# Patient Record
Sex: Female | Born: 2000 | Race: White | Hispanic: No | Marital: Single | Smoking: Never smoker
Health system: Southern US, Community
[De-identification: ages and names within clinical notes are randomized; demographics above are authoritative.]

## PROBLEM LIST (undated history)

## (undated) DIAGNOSIS — J45909 Unspecified asthma, uncomplicated: Secondary | ICD-10-CM

## (undated) DIAGNOSIS — F419 Anxiety disorder, unspecified: Secondary | ICD-10-CM

## (undated) DIAGNOSIS — N390 Urinary tract infection, site not specified: Secondary | ICD-10-CM

## (undated) DIAGNOSIS — N926 Irregular menstruation, unspecified: Secondary | ICD-10-CM

## (undated) DIAGNOSIS — N920 Excessive and frequent menstruation with regular cycle: Secondary | ICD-10-CM

## (undated) HISTORY — DX: Urinary tract infection, site not specified: N39.0

## (undated) HISTORY — DX: Irregular menstruation, unspecified: N92.6

## (undated) HISTORY — DX: Excessive and frequent menstruation with regular cycle: N92.0

## (undated) HISTORY — PX: NO PAST SURGERIES: SHX2092

---
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
## 2021-01-05 ENCOUNTER — Ambulatory Visit: Payer: Federal, State, Local not specified - PPO

## 2021-01-07 ENCOUNTER — Other Ambulatory Visit: Payer: Self-pay

## 2021-01-07 ENCOUNTER — Ambulatory Visit (INDEPENDENT_AMBULATORY_CARE_PROVIDER_SITE_OTHER): Payer: Federal, State, Local not specified - PPO | Admitting: Certified Nurse Midwife

## 2021-01-07 VITALS — BP 130/70 | HR 80 | Ht 62.0 in | Wt 128.6 lb

## 2021-01-07 DIAGNOSIS — Z3042 Encounter for surveillance of injectable contraceptive: Secondary | ICD-10-CM | POA: Diagnosis not present

## 2021-01-07 MED ORDER — MEDROXYPROGESTERONE ACETATE 150 MG/ML IM SUSP
150.0000 mg | Freq: Once | INTRAMUSCULAR | Status: AC
  Administered 2021-01-07: 150 mg via INTRAMUSCULAR

## 2021-01-07 NOTE — Progress Notes (Signed)
Pt presents for depo injection administered R glute with no adverse reactions. Patient verbalized no questions or concerns.

## 2021-02-07 ENCOUNTER — Other Ambulatory Visit: Payer: Self-pay

## 2021-02-07 ENCOUNTER — Emergency Department: Payer: Self-pay

## 2021-02-07 ENCOUNTER — Emergency Department
Admission: EM | Admit: 2021-02-07 | Discharge: 2021-02-07 | Disposition: A | Discharge status: Home / Self Care | Payer: Self-pay | Attending: Student in an Organized Health Care Education/Training Program | Admitting: Student in an Organized Health Care Education/Training Program

## 2021-02-07 DIAGNOSIS — R197 Diarrhea, unspecified: Secondary | ICD-10-CM | POA: Insufficient documentation

## 2021-02-07 DIAGNOSIS — R102 Pelvic and perineal pain: Secondary | ICD-10-CM | POA: Insufficient documentation

## 2021-02-07 DIAGNOSIS — R1030 Lower abdominal pain, unspecified: Secondary | ICD-10-CM

## 2021-02-07 LAB — COMPREHENSIVE METABOLIC PANEL
ALT: 15 U/L (ref 0–44)
AST: 16 U/L (ref 15–41)
Albumin: 5.1 g/dL — ABNORMAL HIGH (ref 3.5–5.0)
Alkaline Phosphatase: 75 U/L (ref 38–126)
Anion gap: 7 (ref 5–15)
BUN: 14 mg/dL (ref 6–20)
CO2: 24 mmol/L (ref 22–32)
Calcium: 9.7 mg/dL (ref 8.9–10.3)
Chloride: 107 mmol/L (ref 98–111)
Creatinine, Ser: 0.75 mg/dL (ref 0.44–1.00)
GFR, Estimated: >60 mL/min (ref >60)
Glucose, Bld: 124 mg/dL — ABNORMAL HIGH (ref 70–99)
Potassium: 3.7 mmol/L (ref 3.5–5.1)
Sodium: 138 mmol/L (ref 135–145)
Total Bilirubin: 0.7 mg/dL (ref 0.3–1.2)
Total Protein: 7.3 g/dL (ref 6.5–8.1)

## 2021-02-07 LAB — URINALYSIS, COMPLETE (UACMP) WITH MICROSCOPIC
Bilirubin Urine: NEGATIVE
Glucose, UA: NEGATIVE mg/dL
Hgb urine dipstick: NEGATIVE
Ketones, ur: NEGATIVE mg/dL
Leukocytes,Ua: NEGATIVE
Nitrite: NEGATIVE
Protein, ur: NEGATIVE mg/dL
Specific Gravity, Urine: 1.015 (ref 1.005–1.030)
pH: 6 (ref 5.0–8.0)

## 2021-02-07 LAB — CBC
HCT: 42.8 % (ref 36.0–46.0)
Hemoglobin: 15.6 g/dL — ABNORMAL HIGH (ref 12.0–15.0)
MCH: 30.9 pg (ref 26.0–34.0)
MCHC: 36.4 g/dL — ABNORMAL HIGH (ref 30.0–36.0)
MCV: 84.8 fL (ref 80.0–100.0)
Platelets: 219 10*3/uL (ref 150–400)
RBC: 5.05 MIL/uL (ref 3.87–5.11)
RDW: 11.8 % (ref 11.5–15.5)
WBC: 6.6 10*3/uL (ref 4.0–10.5)
nRBC: 0 % (ref 0.0–0.2)

## 2021-02-07 LAB — POC URINE PREG, ED: Preg Test, Ur: NEGATIVE

## 2021-02-07 LAB — LIPASE, BLOOD: Lipase: 38 U/L (ref 11–51)

## 2021-02-07 MED ORDER — ONDANSETRON 4 MG PO TBDP
4.0000 mg | ORAL_TABLET | Freq: Once | ORAL | Status: AC
  Administered 2021-02-07: 4 mg via ORAL
  Filled 2021-02-07: qty 1

## 2021-02-07 MED ORDER — KETOROLAC TROMETHAMINE 30 MG/ML IJ SOLN
30.0000 mg | Freq: Once | INTRAMUSCULAR | Status: AC
  Administered 2021-02-07: 30 mg via INTRAMUSCULAR
  Filled 2021-02-07: qty 1

## 2021-02-07 MED ORDER — ONDANSETRON 4 MG PO TBDP: 4.0000 mg | ORAL_TABLET | Freq: Three times a day (TID) | ORAL | 0 refills | Status: DC | PRN

## 2021-02-07 MED ORDER — DICYCLOMINE HCL 10 MG PO CAPS
10.0000 mg | ORAL_CAPSULE | Freq: Three times a day (TID) | ORAL | 0 refills | Status: DC | PRN
Start: 2021-02-07 — End: 2021-02-16

## 2021-02-07 NOTE — Discharge Instructions (Signed)

## 2021-02-07 NOTE — ED Triage Notes (Signed)
Pt here with abd pain that started 3 days ago. Pt states pain is lower across her abd and she also has N/V/D. Pt also has pain in her back.

## 2021-02-07 NOTE — ED Notes (Signed)
Patient transported to Ultrasound 

## 2021-02-07 NOTE — ED Provider Notes (Signed)
[facility] Emergency Department Provider Note    Event Date/Time   First MD Initiated Contact with Patient 02/07/21 1133     (approximate)  I have reviewed the triage vital signs and the nursing notes.   HISTORY  Chief Complaint Abdominal Pain    HPI Jennifer Francis is a 20 y.o. female below listed past medical history presents to the ER for evaluation of 3 days of pelvic pain associated with multiple episodes of watery nonbloody diarrhea.  Said that she has had roughly 20 episodes of diarrhea.  Denies any measured temperatures or chills.  Is never felt like this before.  No lateralizing pain.  Is uncomfortable on both sides.  She is on the Depo shot.  She denies any dysuria.  She denies any vaginal bleeding or discharge.  No history of IBD.  Past Medical History:  Diagnosis Date   Heavy periods    Irregular periods    UTI (urinary tract infection)    Family History  Problem Relation Age of Onset   Breast cancer Maternal Grandmother        not sure of age   Diabetes Maternal Grandmother    Hypertension Maternal Grandfather    Ovarian cancer Neg Hx    Colon cancer Neg Hx    No past surgical history on file. Patient Active Problem List   Diagnosis Date Noted   Irregular menses 03/29/2018      Prior to Admission medications   Medication Sig Start Date End Date Taking? Authorizing Provider  dicyclomine (BENTYL) 10 MG capsule Take 1 capsule (10 mg total) by mouth 3 (three) times daily as needed for up to 14 days for spasms. 02/07/21 02/21/21 Yes Willy Eddy, MD  ondansetron (ZOFRAN ODT) 4 MG disintegrating tablet Take 1 tablet (4 mg total) by mouth every 8 (eight) hours as needed for nausea or vomiting. 02/07/21  Yes Willy Eddy, MD  medroxyPROGESTERone (DEPO-PROVERA) 150 MG/ML injection Inject 1 mL (150 mg total) into the muscle every 3 (three) months. 10/27/20   Doreene Burke, CNM    Allergies Sulfa antibiotics    Social  History Social History   Tobacco Use   Smoking status: Never   Smokeless tobacco: Never  Vaping Use   Vaping Use: Never used  Substance Use Topics   Alcohol use: Never   Drug use: Never    Review of Systems Patient denies headaches, rhinorrhea, blurry vision, numbness, shortness of breath, chest pain, edema, cough, abdominal pain, nausea, vomiting, diarrhea, dysuria, fevers, rashes or hallucinations unless otherwise stated above in HPI. ____________________________________________   PHYSICAL EXAM:  VITAL SIGNS: Vitals:   02/07/21 1010  BP: 108/78  Pulse: 74  Resp: 18  Temp: 99.2 F (37.3 C)  SpO2: 98%    Constitutional: Alert and oriented.  Eyes: Conjunctivae are normal.  Head: Atraumatic. Nose: No congestion/rhinnorhea. Mouth/Throat: Mucous membranes are moist.   Neck: No stridor. Painless ROM.  Cardiovascular: Normal rate, regular rhythm. Grossly normal heart sounds.  Good peripheral circulation. Respiratory: Normal respiratory effort.  No retractions. Lungs CTAB. Gastrointestinal: Soft and nontender. No distention. No abdominal bruits. No CVA tenderness. Genitourinary:  Musculoskeletal: No lower extremity tenderness nor edema.  No joint effusions. Neurologic:  Normal speech and language. No gross focal neurologic deficits are appreciated. No facial droop Skin:  Skin is warm, dry and intact. No rash noted. Psychiatric: Mood and affect are normal. Speech and behavior are normal.  ____________________________________________   LABS (all labs ordered are listed, but  only abnormal results are displayed)  Results for orders placed or performed during the hospital encounter of 02/07/21 (from the past 24 hour(s))  Lipase, blood     Status: None   Collection Time: 02/07/21 10:14 AM  Result Value Ref Range   Lipase 38 11 - 51 U/L  Comprehensive metabolic panel     Status: Abnormal   Collection Time: 02/07/21 10:14 AM  Result Value Ref Range   Sodium 138 135 - 145  mmol/L   Potassium 3.7 3.5 - 5.1 mmol/L   Chloride 107 98 - 111 mmol/L   CO2 24 22 - 32 mmol/L   Glucose, Bld 124 (H) 70 - 99 mg/dL   BUN 14 6 - 20 mg/dL   Creatinine, Ser 9.48 0.44 - 1.00 mg/dL   Calcium 9.7 8.9 - 54.6 mg/dL   Total Protein 7.3 6.5 - 8.1 g/dL   Albumin 5.1 (H) 3.5 - 5.0 g/dL   AST 16 15 - 41 U/L   ALT 15 0 - 44 U/L   Alkaline Phosphatase 75 38 - 126 U/L   Total Bilirubin 0.7 0.3 - 1.2 mg/dL   GFR, Estimated >27 >03 mL/min   Anion gap 7 5 - 15  CBC     Status: Abnormal   Collection Time: 02/07/21 10:14 AM  Result Value Ref Range   WBC 6.6 4.0 - 10.5 K/uL   RBC 5.05 3.87 - 5.11 MIL/uL   Hemoglobin 15.6 (H) 12.0 - 15.0 g/dL   HCT 50.0 93.8 - 18.2 %   MCV 84.8 80.0 - 100.0 fL   MCH 30.9 26.0 - 34.0 pg   MCHC 36.4 (H) 30.0 - 36.0 g/dL   RDW 99.3 71.6 - 96.7 %   Platelets 219 150 - 400 K/uL   nRBC 0.0 0.0 - 0.2 %  Urinalysis, Complete w Microscopic Urine, Random     Status: Abnormal   Collection Time: 02/07/21 10:14 AM  Result Value Ref Range   Color, Urine YELLOW YELLOW   APPearance CLEAR CLEAR   Specific Gravity, Urine 1.015 1.005 - 1.030   pH 6.0 5.0 - 8.0   Glucose, UA NEGATIVE NEGATIVE mg/dL   Hgb urine dipstick NEGATIVE NEGATIVE   Bilirubin Urine NEGATIVE NEGATIVE   Ketones, ur NEGATIVE NEGATIVE mg/dL   Protein, ur NEGATIVE NEGATIVE mg/dL   Nitrite NEGATIVE NEGATIVE   Leukocytes,Ua NEGATIVE NEGATIVE   RBC / HPF 0-5 0 - 5 RBC/hpf   WBC, UA 0-5 0 - 5 WBC/hpf   Bacteria, UA RARE (A) NONE SEEN   Squamous Epithelial / LPF 0-5 0 - 5   Mucus PRESENT   POC urine preg, ED     Status: None   Collection Time: 02/07/21 10:59 AM  Result Value Ref Range   Preg Test, Ur NEGATIVE NEGATIVE   ____________________________________________ ____________________________________________  RADIOLOGY  I personally reviewed all radiographic images ordered to evaluate for the above acute complaints and reviewed radiology reports and findings.  These findings were  personally discussed with the patient.  Please see medical record for radiology report.  ____________________________________________   PROCEDURES  Procedure(s) performed:  Procedures    Critical Care performed: no ____________________________________________   INITIAL IMPRESSION / ASSESSMENT AND PLAN / ED COURSE  Pertinent labs & imaging results that were available during my care of the patient were reviewed by me and considered in my medical decision making (see chart for details).   DDX: Colitis, enteritis, torsion, cyst, pregnancy, UTI, ascites, diverticulitis, IBD, IBS  Jennifer Francis  is a 20 y.o. who presents to the ED with presentation as described above.  She is clinically very well-appearing no acute distress.  No fever no tachycardia.  No white count.  Blood work otherwise reassuring.  Denies any dysuria or urinary tract symptoms.  Denies any symptoms of PID.  Ultrasound will be ordered to evaluate for lower abdominal pain.  I have a lower suspicion for appendicitis based on reassuring exam.  And diarrheal illness higher suspicion that she may have some enteritis is worsening quite a few patients over the past few days with similar symptoms.  Clinical Course as of 02/07/21 1324  Sun Feb 07, 2021  1316 Patient reassessed.  Feels well.  No additional diarrhea here in the ER.  Given reassuring work-up do not feel that CT imaging of the abdomen is clinically indicated given lack of pain white count or fever.  Have a low suspicion for appendicitis.  Most likely viral illness but at this point he wishes stable and appropriate for observation as an outpatient with strict return precautions and patient is agreeable to plan. [PR]    Clinical Course User Index [PR] Willy Eddy, MD    The patient was evaluated in Emergency Department today for the symptoms described in the history of present illness. He/she was evaluated in the context of the global COVID-19 pandemic, which  necessitated consideration that the patient might be at risk for infection with the SARS-CoV-2 virus that causes COVID-19. Institutional protocols and algorithms that pertain to the evaluation of patients at risk for COVID-19 are in a state of rapid change based on information released by regulatory bodies including the CDC and federal and state organizations. These policies and algorithms were followed during the patient's care in the ED.  As part of my medical decision making, I reviewed the following data within the electronic MEDICAL RECORD NUMBER Nursing notes reviewed and incorporated, Labs reviewed, notes from prior ED visits and [place] Controlled Substance Database   ____________________________________________   FINAL CLINICAL IMPRESSION(S) / ED DIAGNOSES  Final diagnoses:  Pelvic pain  Lower abdominal pain  Diarrhea, unspecified type      NEW MEDICATIONS STARTED DURING THIS VISIT:  New Prescriptions   DICYCLOMINE (BENTYL) 10 MG CAPSULE    Take 1 capsule (10 mg total) by mouth 3 (three) times daily as needed for up to 14 days for spasms.   ONDANSETRON (ZOFRAN ODT) 4 MG DISINTEGRATING TABLET    Take 1 tablet (4 mg total) by mouth every 8 (eight) hours as needed for nausea or vomiting.     Note:  This document was prepared using Dragon voice recognition software and may include unintentional dictation errors.    Willy Eddy, MD 02/07/21 1324

## 2021-02-09 ENCOUNTER — Other Ambulatory Visit: Payer: Self-pay

## 2021-02-09 ENCOUNTER — Emergency Department
Admission: EM | Admit: 2021-02-09 | Discharge: 2021-02-09 | Disposition: A | Discharge status: Home / Self Care | Payer: Self-pay | Attending: Emergency Medicine | Admitting: Emergency Medicine

## 2021-02-09 ENCOUNTER — Emergency Department: Payer: Self-pay

## 2021-02-09 ENCOUNTER — Encounter: Payer: Self-pay | Admitting: Radiology

## 2021-02-09 DIAGNOSIS — R1031 Right lower quadrant pain: Secondary | ICD-10-CM | POA: Insufficient documentation

## 2021-02-09 DIAGNOSIS — R1032 Left lower quadrant pain: Secondary | ICD-10-CM | POA: Insufficient documentation

## 2021-02-09 DIAGNOSIS — R197 Diarrhea, unspecified: Secondary | ICD-10-CM | POA: Insufficient documentation

## 2021-02-09 LAB — URINALYSIS, COMPLETE (UACMP) WITH MICROSCOPIC
Bilirubin Urine: NEGATIVE
Glucose, UA: NEGATIVE mg/dL
Hgb urine dipstick: NEGATIVE
Ketones, ur: NEGATIVE mg/dL
Nitrite: NEGATIVE
Protein, ur: NEGATIVE mg/dL
Specific Gravity, Urine: 1.015 (ref 1.005–1.030)
pH: 7.5 (ref 5.0–8.0)

## 2021-02-09 LAB — COMPREHENSIVE METABOLIC PANEL
ALT: 13 U/L (ref 0–44)
AST: 14 U/L — ABNORMAL LOW (ref 15–41)
Albumin: 4.8 g/dL (ref 3.5–5.0)
Alkaline Phosphatase: 74 U/L (ref 38–126)
Anion gap: 6 (ref 5–15)
BUN: 10 mg/dL (ref 6–20)
CO2: 24 mmol/L (ref 22–32)
Calcium: 9.3 mg/dL (ref 8.9–10.3)
Chloride: 108 mmol/L (ref 98–111)
Creatinine, Ser: 0.79 mg/dL (ref 0.44–1.00)
GFR, Estimated: >60 mL/min (ref >60)
Glucose, Bld: 101 mg/dL — ABNORMAL HIGH (ref 70–99)
Potassium: 4.3 mmol/L (ref 3.5–5.1)
Sodium: 138 mmol/L (ref 135–145)
Total Bilirubin: 0.6 mg/dL (ref 0.3–1.2)
Total Protein: 7 g/dL (ref 6.5–8.1)

## 2021-02-09 LAB — CBC
HCT: 39.7 % (ref 36.0–46.0)
Hemoglobin: 14.5 g/dL (ref 12.0–15.0)
MCH: 30.6 pg (ref 26.0–34.0)
MCHC: 36.5 g/dL — ABNORMAL HIGH (ref 30.0–36.0)
MCV: 83.8 fL (ref 80.0–100.0)
Platelets: 201 10*3/uL (ref 150–400)
RBC: 4.74 MIL/uL (ref 3.87–5.11)
RDW: 11.6 % (ref 11.5–15.5)
WBC: 5.7 10*3/uL (ref 4.0–10.5)
nRBC: 0 % (ref 0.0–0.2)

## 2021-02-09 LAB — POC URINE PREG, ED: Preg Test, Ur: NEGATIVE

## 2021-02-09 MED ORDER — IOHEXOL 350 MG/ML SOLN
75.0000 mL | Freq: Once | INTRAVENOUS | Status: AC | PRN
  Administered 2021-02-09: 75 mL via INTRAVENOUS
  Filled 2021-02-09: qty 75

## 2021-02-09 MED ORDER — KETOROLAC TROMETHAMINE 30 MG/ML IJ SOLN
15.0000 mg | Freq: Once | INTRAMUSCULAR | Status: AC
  Administered 2021-02-09: 15 mg via INTRAVENOUS
  Filled 2021-02-09: qty 1

## 2021-02-09 NOTE — ED Provider Notes (Signed)
[facility]  ____________________________________________   Event Date/Time   First MD Initiated Contact with Patient 02/09/21 1259     (approximate)  I have reviewed the triage vital signs and the nursing notes.   HISTORY  Chief Complaint Abdominal Pain    HPI Jennifer Francis is a 20 y.o. female with past medical history that is noncontributory who presents with abdominal pain.  Symptoms have been going on for the past 4 days.  Pain was initially located in the bilateral lower quadrants and lower abdomen.  She has had multiple days of diarrhea, denies blood in her stool.  She denies urinary symptoms vaginal discharge or vaginal bleeding.  She is on Depo does not get a regular..  She was seen yesterday in the ED and had an ultrasound of the pelvis that was normal.  She was told to return to the emergency department if pain localized to the right lower quadrant.  Today the pain is primarily in the right lower quadrant.  Stabbing in nature.  She is tolerating p.o. no nausea or vomiting.         Past Medical History:  Diagnosis Date   Heavy periods    Irregular periods    UTI (urinary tract infection)     Patient Active Problem List   Diagnosis Date Noted   Irregular menses 03/29/2018    No past surgical history on file.  Prior to Admission medications   Medication Sig Start Date End Date Taking? Authorizing Provider  dicyclomine (BENTYL) 10 MG capsule Take 1 capsule (10 mg total) by mouth 3 (three) times daily as needed for up to 14 days for spasms. 02/07/21 02/21/21  Willy Eddy, MD  medroxyPROGESTERone (DEPO-PROVERA) 150 MG/ML injection Inject 1 mL (150 mg total) into the muscle every 3 (three) months. 10/27/20   Doreene Burke, CNM  ondansetron (ZOFRAN ODT) 4 MG disintegrating tablet Take 1 tablet (4 mg total) by mouth every 8 (eight) hours as needed for nausea or vomiting. 02/07/21   Willy Eddy, MD    Allergies Sulfa  antibiotics  Family History  Problem Relation Age of Onset   Breast cancer Maternal Grandmother        not sure of age   Diabetes Maternal Grandmother    Hypertension Maternal Grandfather    Ovarian cancer Neg Hx    Colon cancer Neg Hx     Social History Social History   Tobacco Use   Smoking status: Never   Smokeless tobacco: Never  Vaping Use   Vaping Use: Never used  Substance Use Topics   Alcohol use: Never   Drug use: Never    Review of Systems   Review of Systems  Constitutional:  Negative for chills and fever.  Respiratory:  Negative for shortness of breath.   Cardiovascular:  Negative for chest pain.  Gastrointestinal:  Positive for abdominal pain and diarrhea. Negative for nausea and vomiting.  Genitourinary:  Negative for dysuria, vaginal bleeding, vaginal discharge and vaginal pain.  All other systems reviewed and are negative.  Physical Exam Updated Vital Signs BP 115/66 (BP Location: Right Arm)   Pulse 78   Temp 98.7 F (37.1 C) (Oral)   Resp 16   Ht 5\' 2"  (1.575 m)   Wt 59.4 kg   SpO2 98%   BMI 23.96 kg/m   Physical Exam Vitals and nursing note reviewed.  Constitutional:      General: She is not in acute distress.    Appearance: Normal  appearance.  HENT:     Head: Normocephalic and atraumatic.  Eyes:     General: No scleral icterus.    Conjunctiva/sclera: Conjunctivae normal.  Pulmonary:     Effort: Pulmonary effort is normal. No respiratory distress.     Breath sounds: No stridor.  Abdominal:     Tenderness: There is abdominal tenderness.     Comments: Min is soft throughout, mild tenderness to deep palpation in the right lower quadrant, no masses no guarding  Musculoskeletal:        General: No deformity or signs of injury.     Cervical back: Normal range of motion.  Skin:    General: Skin is dry.     Coloration: Skin is not jaundiced or pale.  Neurological:     General: No focal deficit present.     Mental Status: She is alert  and oriented to person, place, and time. Mental status is at baseline.  Psychiatric:        Mood and Affect: Mood normal.        Behavior: Behavior normal.     LABS (all labs ordered are listed, but only abnormal results are displayed)  Labs Reviewed  COMPREHENSIVE METABOLIC PANEL - Abnormal; Notable for the following components:      Result Value   Glucose, Bld 101 (*)    AST 14 (*)    All other components within normal limits  CBC - Abnormal; Notable for the following components:   MCHC 36.5 (*)    All other components within normal limits  URINALYSIS, COMPLETE (UACMP) WITH MICROSCOPIC - Abnormal; Notable for the following components:   Leukocytes,Ua TRACE (*)    Bacteria, UA RARE (*)    All other components within normal limits  POC URINE PREG, ED   ____________________________________________  EKG  N/a ____________________________________________  RADIOLOGY Ky Barban, personally viewed and evaluated these images (plain radiographs) as part of my medical decision making, as well as reviewing the written report by the radiologist.  ED MD interpretation: CT abdomen pelvis with contrast obtained and reviewed by radiology myself as negative for acute pathology    ____________________________________________   PROCEDURES  Procedure(s) performed (including Critical Care):  Procedures   ____________________________________________   INITIAL IMPRESSION / ASSESSMENT AND PLAN / ED COURSE     20 year old female presents for second visit for abdominal pain.  Was generalized now localizing to the right lower quadrant associate with diarrhea.  She is very well-appearing on exam and her abdomen is largely benign but her tenderness does localize to the right lower quadrant.  Ultrasound of the pelvis obtained yesterday which was normal.  Her labs today are normal no leukocytosis.  Given this is her second visit and pain has changed we will obtain a CT abdomen  pelvis to rule out appendicitis.  CT abdomen pelvis is normal.  Patient is tolerating p.o.  She is stable for discharge.  Advised that she follow-up with her primary care provider if her symptoms are ongoing.  Clinical Course as of 02/09/21 1513  Tue Feb 09, 2021  1510 IMPRESSION: No acute abdominopelvic findings. Normal appendix.   [KM]    Clinical Course User Index [KM] Georga Hacking, MD     ____________________________________________   FINAL CLINICAL IMPRESSION(S) / ED DIAGNOSES  Final diagnoses:  Right lower quadrant abdominal pain     ED Discharge Orders     None        Note:  This document was prepared using  [employer]agon [employer] and may include unintentional dictation errors.    Georga Hacking, MD 02/09/21 1515

## 2021-02-09 NOTE — Discharge Instructions (Addendum)
Your CAT scan was negative for appendicitis or other causes of abdominal pain.  I expect that your pain should improve over the next several days.  Please follow-up with your primary care provider for evaluation if you continue to have symptoms.

## 2021-02-09 NOTE — ED Triage Notes (Signed)
Pt here by POV with c/o of ABD pain and was seen yesterday for the same and wad told "if the pain moved to the other side to come back immediately".   Pt denies fevers or chills. NAD noted.

## 2021-02-16 ENCOUNTER — Other Ambulatory Visit: Payer: Self-pay

## 2021-02-16 ENCOUNTER — Other Ambulatory Visit: Payer: Self-pay | Admitting: Obstetrics & Gynecology

## 2021-02-16 ENCOUNTER — Ambulatory Visit (INDEPENDENT_AMBULATORY_CARE_PROVIDER_SITE_OTHER): Payer: Self-pay | Admitting: Obstetrics & Gynecology

## 2021-02-16 ENCOUNTER — Encounter: Payer: Self-pay | Admitting: Obstetrics & Gynecology

## 2021-02-16 ENCOUNTER — Telehealth: Payer: Self-pay

## 2021-02-16 VITALS — BP 120/80 | Ht 62.0 in | Wt 129.0 lb

## 2021-02-16 DIAGNOSIS — R102 Pelvic and perineal pain: Secondary | ICD-10-CM

## 2021-02-16 MED ORDER — DOXYCYCLINE HYCLATE 100 MG PO CAPS: 100.0000 mg | ORAL_CAPSULE | Freq: Two times a day (BID) | ORAL | 0 refills | Status: DC

## 2021-02-16 NOTE — Telephone Encounter (Signed)
Called patient to schedule Diagnostic Laparoscopy w Phylliss Blakes 9/22  H&P today in clinic  Pre-admit phone call appointment to be requested - date and time will be included on H&P paper work. Also all appointments will be updated on pt MyChart. Explained that this appointment has a call window. Based on the time scheduled will indicate if the call will be received within a 4 hour window before 1:00 or after.  Advised that pt may also receive calls from the hospital pharmacy and pre-service center.  Patient is having mother check on their insurance. She is on her mother's [employer] plan but it is showing as inactive as of 02/03/21. She will let me know what is figured out.

## 2021-02-16 NOTE — Progress Notes (Signed)
PRE-OPERATIVE HISTORY AND PHYSICAL EXAM  HPI:  Jennifer Francis is a 20 y.o. G0P0000 No LMP recorded. Patient has had an injection.; she is being admitted for surgery related to pelvic pain.  PMHx: Past Medical History:  Diagnosis Date   Heavy periods    Irregular periods    UTI (urinary tract infection)    History reviewed. No pertinent surgical history. Family History  Problem Relation Age of Onset   Breast cancer Maternal Grandmother        not sure of age   Diabetes Maternal Grandmother    Hypertension Maternal Grandfather    Ovarian cancer Neg Hx    Colon cancer Neg Hx    Social History   Tobacco Use   Smoking status: Never   Smokeless tobacco: Never  Vaping Use   Vaping Use: Never used  Substance Use Topics   Alcohol use: Never   Drug use: Never    Current Outpatient Medications:    doxycycline (VIBRAMYCIN) 100 MG capsule, Take 1 capsule (100 mg total) by mouth 2 (two) times daily., Disp: 14 capsule, Rfl: 0   medroxyPROGESTERone (DEPO-PROVERA) 150 MG/ML injection, Inject 1 mL (150 mg total) into the muscle every 3 (three) months., Disp: 1 mL, Rfl: 4  Current Facility-Administered Medications:    medroxyPROGESTERone (DEPO-PROVERA) injection 150 mg, 150 mg, Intramuscular, Q90 days, Doreene Burke, CNM, 150 mg at 10/27/20 1349 Allergies: Sulfa antibiotics  Review of Systems  Constitutional:  Positive for malaise/fatigue and weight loss. Negative for chills and fever.  HENT:  Negative for congestion, sinus pain and sore throat.   Eyes:  Negative for blurred vision and pain.  Respiratory:  Negative for cough and wheezing.   Cardiovascular:  Negative for chest pain and leg swelling.  Gastrointestinal:  Positive for abdominal pain and diarrhea. Negative for constipation, heartburn, nausea and vomiting.  Genitourinary:  Positive for frequency. Negative for dysuria, hematuria and urgency.  Musculoskeletal:  Positive for joint pain. Negative for back pain, myalgias  and neck pain.  Skin:  Negative for itching and rash.  Neurological:  Positive for weakness. Negative for dizziness and tremors.  Endo/Heme/Allergies:  Does not bruise/bleed easily.  Psychiatric/Behavioral:  Positive for depression. The patient is nervous/anxious. The patient does not have insomnia.    Objective: BP 120/80   Ht 5\' 2"  (1.575 m)   Wt 129 lb (58.5 kg)   BMI 23.59 kg/m   Filed Weights   02/16/21 0948  Weight: 129 lb (58.5 kg)   Physical Exam Constitutional:      General: She is not in acute distress.    Appearance: She is well-developed.  Genitourinary:     Right Labia: No rash or tenderness.    Left Labia: No tenderness or rash.    Vaginal tenderness present.     No vaginal erythema or bleeding.      Right Adnexa: tender.    Right Adnexa: no mass present.    Left Adnexa: not tender and no mass present.    Cervical motion tenderness present.     No cervical discharge, polyp or nabothian cyst.     Uterus is tender.     Uterus is not enlarged.     No uterine mass detected.    Uterus is midaxial.     Pelvic exam was performed with patient in the lithotomy position.  HENT:     Head: Normocephalic and atraumatic.     Nose: Nose normal.  Abdominal:  General: There is no distension.     Palpations: Abdomen is soft.     Tenderness: There is generalized abdominal tenderness.  Musculoskeletal:        General: Normal range of motion.  Neurological:     Mental Status: She is alert and oriented to person, place, and time.     Cranial Nerves: No cranial nerve deficit.  Skin:    General: Skin is warm and dry.  Psychiatric:        Attention and Perception: Attention normal.        Mood and Affect: Mood and affect normal.        Speech: Speech normal.        Behavior: Behavior normal.        Thought Content: Thought content normal.        Judgment: Judgment normal.    Assessment: 1. Pelvic pain   Plan Dx Lap to assess for endometriosis or alternative  etiology Also pt is being treated for possible PID  I have had a careful discussion with this patient about all the options available and the risk/benefits of each. I have fully informed this patient that surgery may subject her to a variety of discomforts and risks: She understands that most patients have surgery with little difficulty, but problems can happen ranging from minor to fatal. These include nausea, vomiting, pain, bleeding, infection, poor healing, hernia, or formation of adhesions. Unexpected reactions may occur from any drug or anesthetic given. Unintended injury may occur to other pelvic or abdominal structures such as Fallopian tubes, ovaries, bladder, ureter (tube from kidney to bladder), or bowel. Nerves going from the pelvis to the legs may be injured. Any such injury may require immediate or later additional surgery to correct the problem. Excessive blood loss requiring transfusion is very unlikely but possible. Dangerous blood clots may form in the legs or lungs. Physical and sexual activity will be restricted in varying degrees for an indeterminate period of time but most often 2-6 weeks.  Finally, she understands that it is impossible to list every possible undesirable effect and that the condition for which surgery is done is not always cured or significantly improved, and in rare cases may be even worse.Ample time was given to answer all questions.  Annamarie Major, MD, [facility], [facility] 02/16/2021  10:50 AM

## 2021-02-16 NOTE — Progress Notes (Signed)
Gynecology Pelvic Pain Evaluation   Chief Complaint:  Chief Complaint  Patient presents with   Follow-up    History of Present Illness:   Patient is a 20 y.o. G0P0000 who LMP was No LMP recorded. Patient has had an injection., presents today for a problem visit.  She complains of pain.   Her pain is localized to the deep pelvis area, described as constant, stabbing, and aching, began several months ago and its severity is described as severe. The pain radiates to the  Non-radiating. She has these associated symptoms which include diarrhea and nausea. Patient has these modifiers which include pain medication that make it better and unable to associate with any factor that make it worse.  Context includes: spontaneous.  Their symptoms are aggravated by activity and sex.  Previous evaluation: multiple ER visits over the last week.  She has seen gyn in past, on Depo for periods and birth control (has nop periods).  ER- [place], CT, cultures all neg.  Doxy trial, no relief but only took 2 days. Prior Diagnosis: none. Previous Treatment: NSAID Naprosyn.  PMHx: She  has a past medical history of Heavy periods, Irregular periods, and UTI (urinary tract infection). Also,  has no past surgical history on file., family history includes Breast cancer in her maternal grandmother; Diabetes in her maternal grandmother; Hypertension in her maternal grandfather.,  reports that she has never smoked. She has never used smokeless tobacco. She reports that she does not drink alcohol and does not use drugs.  She has a current medication list which includes the following prescription(s): doxycycline and medroxyprogesterone, and the following Facility-Administered Medications: medroxyprogesterone. Also, is allergic to sulfa antibiotics.  Review of Systems  Constitutional:  Positive for malaise/fatigue and weight loss. Negative for chills and fever.  HENT:  Negative for congestion, sinus pain and sore throat.   Eyes:   Negative for blurred vision and pain.  Respiratory:  Negative for cough and wheezing.   Cardiovascular:  Negative for chest pain and leg swelling.  Gastrointestinal:  Positive for abdominal pain, diarrhea and nausea. Negative for constipation, heartburn and vomiting.  Genitourinary:  Positive for frequency. Negative for dysuria, hematuria and urgency.  Musculoskeletal:  Positive for joint pain. Negative for back pain, myalgias and neck pain.  Skin:  Negative for itching and rash.  Neurological:  Positive for dizziness and weakness. Negative for tremors.  Endo/Heme/Allergies:  Does not bruise/bleed easily.  Psychiatric/Behavioral:  Positive for depression. The patient is nervous/anxious. The patient does not have insomnia.    Objective: BP 120/80   Ht 5\' 2"  (1.575 m)   Wt 129 lb (58.5 kg)   BMI 23.59 kg/m  Physical Exam Constitutional:      General: She is not in acute distress.    Appearance: She is well-developed.  Genitourinary:     Right Labia: No rash or tenderness.    Left Labia: No tenderness or rash.    Vaginal tenderness present.     No vaginal erythema or bleeding.      Right Adnexa: tender.    Right Adnexa: no mass present.    Left Adnexa: tender.    Left Adnexa: no mass present.    Cervical motion tenderness present.     No cervical discharge, polyp or nabothian cyst.     Uterus is tender.     Uterus is not enlarged.     No uterine mass detected.    Uterus is midaxial.     Pelvic exam was  performed with patient in the lithotomy position.  HENT:     Head: Normocephalic and atraumatic.     Nose: Nose normal.  Abdominal:     General: There is no distension.     Palpations: Abdomen is soft.     Tenderness: There is generalized abdominal tenderness.     Comments: Worse on RLQ  Musculoskeletal:        General: Normal range of motion.  Neurological:     Mental Status: She is alert and oriented to person, place, and time.     Cranial Nerves: No cranial nerve  deficit.  Skin:    General: Skin is warm and dry.  Psychiatric:        Attention and Perception: Attention normal.        Mood and Affect: Mood and affect normal.        Speech: Speech normal.        Behavior: Behavior normal.        Thought Content: Thought content normal.        Judgment: Judgment normal.    Female chaperone present for pelvic portion of the physical exam  Assessment: 20 y.o. G0P0000 with  1. Pelvic pain Restart and finish course of Doxycycline as there is inflammatory nature to her pain; even w neg cervicovag cultures for chlamydia it could still be PID  Plan Dx Lap as this has chronic nature to it as well, for endometriosis Scans all normal, low suspicion for mass or cyst  Depo helped w periods, not pain Consider alternative options, may be based on surgery results  Annamarie Major, MD, [facility], [facility] 02/16/2021  10:43 AM

## 2021-02-16 NOTE — Telephone Encounter (Signed)
-----   Message from Nadara Mustard, MD sent at 02/16/2021 10:40 AM EDT ----- Regarding: Surgery Surgery Booking Request Patient Full Name:  Jennifer Francis  MRN: [id number]  DOB: 2001/01/25  Surgeon: Letitia Libra, MD  Requested Surgery Date and Time: 9/22 or 9/27 Primary Diagnosis AND Code: Pelvic pain Secondary Diagnosis and Code:  Surgical Procedure: Diagnostic Laparoscopy RNFA Requested?: No L&D Notification: No Admission Status: same day surgery Length of Surgery: 50 min Special Case Needs: No H&P: No   -   CONSENTS DONE TODAY Phone Interview???:  Yes Interpreter: No Medical Clearance:  No Special Scheduling Instructions: No Any known health/anesthesia issues, diabetes, sleep apnea, latex allergy, defibrillator/pacemaker?: No Acuity: P3   (P1 highest, P2 delay may cause harm, P3 low, elective gyn, P4 lowest) Post op follow up visits: yes

## 2021-02-16 NOTE — H&P (View-Only) (Signed)
PRE-OPERATIVE HISTORY AND PHYSICAL EXAM  HPI:  Jennifer Francis is a 20 y.o. G0P0000 No LMP recorded. Patient has had an injection.; she is being admitted for surgery related to pelvic pain.  PMHx: Past Medical History:  Diagnosis Date   Heavy periods    Irregular periods    UTI (urinary tract infection)    History reviewed. No pertinent surgical history. Family History  Problem Relation Age of Onset   Breast cancer Maternal Grandmother        not sure of age   Diabetes Maternal Grandmother    Hypertension Maternal Grandfather    Ovarian cancer Neg Hx    Colon cancer Neg Hx    Social History   Tobacco Use   Smoking status: Never   Smokeless tobacco: Never  Vaping Use   Vaping Use: Never used  Substance Use Topics   Alcohol use: Never   Drug use: Never    Current Outpatient Medications:    doxycycline (VIBRAMYCIN) 100 MG capsule, Take 1 capsule (100 mg total) by mouth 2 (two) times daily., Disp: 14 capsule, Rfl: 0   medroxyPROGESTERone (DEPO-PROVERA) 150 MG/ML injection, Inject 1 mL (150 mg total) into the muscle every 3 (three) months., Disp: 1 mL, Rfl: 4  Current Facility-Administered Medications:    medroxyPROGESTERone (DEPO-PROVERA) injection 150 mg, 150 mg, Intramuscular, Q90 days, Doreene Burke, CNM, 150 mg at 10/27/20 1349 Allergies: Sulfa antibiotics  Review of Systems  Constitutional:  Positive for malaise/fatigue and weight loss. Negative for chills and fever.  HENT:  Negative for congestion, sinus pain and sore throat.   Eyes:  Negative for blurred vision and pain.  Respiratory:  Negative for cough and wheezing.   Cardiovascular:  Negative for chest pain and leg swelling.  Gastrointestinal:  Positive for abdominal pain and diarrhea. Negative for constipation, heartburn, nausea and vomiting.  Genitourinary:  Positive for frequency. Negative for dysuria, hematuria and urgency.  Musculoskeletal:  Positive for joint pain. Negative for back pain, myalgias  and neck pain.  Skin:  Negative for itching and rash.  Neurological:  Positive for weakness. Negative for dizziness and tremors.  Endo/Heme/Allergies:  Does not bruise/bleed easily.  Psychiatric/Behavioral:  Positive for depression. The patient is nervous/anxious. The patient does not have insomnia.    Objective: BP 120/80   Ht 5\' 2"  (1.575 m)   Wt 129 lb (58.5 kg)   BMI 23.59 kg/m   Filed Weights   02/16/21 0948  Weight: 129 lb (58.5 kg)   Physical Exam Constitutional:      General: She is not in acute distress.    Appearance: She is well-developed.  Genitourinary:     Right Labia: No rash or tenderness.    Left Labia: No tenderness or rash.    Vaginal tenderness present.     No vaginal erythema or bleeding.      Right Adnexa: tender.    Right Adnexa: no mass present.    Left Adnexa: not tender and no mass present.    Cervical motion tenderness present.     No cervical discharge, polyp or nabothian cyst.     Uterus is tender.     Uterus is not enlarged.     No uterine mass detected.    Uterus is midaxial.     Pelvic exam was performed with patient in the lithotomy position.  HENT:     Head: Normocephalic and atraumatic.     Nose: Nose normal.  Abdominal:  General: There is no distension.     Palpations: Abdomen is soft.     Tenderness: There is generalized abdominal tenderness.  Musculoskeletal:        General: Normal range of motion.  Neurological:     Mental Status: She is alert and oriented to person, place, and time.     Cranial Nerves: No cranial nerve deficit.  Skin:    General: Skin is warm and dry.  Psychiatric:        Attention and Perception: Attention normal.        Mood and Affect: Mood and affect normal.        Speech: Speech normal.        Behavior: Behavior normal.        Thought Content: Thought content normal.        Judgment: Judgment normal.    Assessment: 1. Pelvic pain   Plan Dx Lap to assess for endometriosis or alternative  etiology Also pt is being treated for possible PID  I have had a careful discussion with this patient about all the options available and the risk/benefits of each. I have fully informed this patient that surgery may subject her to a variety of discomforts and risks: She understands that most patients have surgery with little difficulty, but problems can happen ranging from minor to fatal. These include nausea, vomiting, pain, bleeding, infection, poor healing, hernia, or formation of adhesions. Unexpected reactions may occur from any drug or anesthetic given. Unintended injury may occur to other pelvic or abdominal structures such as Fallopian tubes, ovaries, bladder, ureter (tube from kidney to bladder), or bowel. Nerves going from the pelvis to the legs may be injured. Any such injury may require immediate or later additional surgery to correct the problem. Excessive blood loss requiring transfusion is very unlikely but possible. Dangerous blood clots may form in the legs or lungs. Physical and sexual activity will be restricted in varying degrees for an indeterminate period of time but most often 2-6 weeks.  Finally, she understands that it is impossible to list every possible undesirable effect and that the condition for which surgery is done is not always cured or significantly improved, and in rare cases may be even worse.Ample time was given to answer all questions.  Annamarie Major, MD, [facility], [facility] 02/16/2021  10:50 AM

## 2021-02-16 NOTE — Patient Instructions (Signed)
Diagnostic Laparoscopy Diagnostic laparoscopy is a procedure to diagnose problems in the abdomen. It might be done for a variety of reasons, such as to look for scar tissue, a reason for abdominal pain, an abdominal mass or tumor, or fluid in the abdomen (ascites). This procedure may also be done to remove a tissue sample from the liver to look at under a microscope (biopsy). During the procedure, a thin, flexible tube that has a light and a camera on the end (laparoscope) is inserted through a small incision in the abdomen. The image from the camera is shown on a monitor to help the surgeon see inside the body. Tell a health care provider about: Any allergies you have. All medicines you are taking, including vitamins, herbs, eye drops, creams, and over-the-counter medicines. Any problems you or family members have had with anesthetic medicines. Any blood disorders you have. Any surgeries you have had. Any medical conditions you have. Whether you are pregnant or may be pregnant. What are the risks? Generally, this is a safe procedure. However, problems may occur, including: Infection. Bleeding. Allergic reactions to medicines or dyes. Damage to abdominal structures or organs, such as the intestines, liver, stomach, or spleen. What happens before the procedure? Staying hydrated Follow instructions from your health care provider about hydration, which may include: Up to 2 hours before the procedure - you may continue to drink clear liquids, such as water, clear fruit juice, black coffee, and plain tea.  Eating and drinking restrictions Follow instructions from your health care provider about eating and drinking, which may include: 8 hours before the procedure - stop eating heavy meals or foods, such as meat, fried foods, or fatty foods. 6 hours before the procedure - stop eating light meals or foods, such as toast or cereal. 6 hours before the procedure - stop drinking milk or drinks that  contain milk. 2 hours before the procedure - stop drinking clear liquids. Medicines Ask your health care provider about: Changing or stopping your regular medicines. This is especially important if you are taking diabetes medicines or blood thinners. Taking medicines such as aspirin and ibuprofen. These medicines can thin your blood. Do not take these medicines unless your health care provider tells you to take them. Taking over-the-counter medicines, vitamins, herbs, and supplements. General instructions Ask your health care provider: How your surgery site will be marked. What steps will be taken to help prevent infection. These steps may include: Removing hair at the surgery site. Washing skin with a germ-killing soap. Taking antibiotic medicine. Plan to have a responsible adult take you home from the hospital or clinic. Plan to have a responsible adult care for you for the time you are told after you leave the hospital or clinic. This is important. What happens during the procedure?  An IV will be inserted into one of your veins. You will be given one or more of the following: A medicine to help you relax (sedative). A medicine to numb the area (local anesthetic). A medicine to make you fall asleep (general anesthetic). A breathing tube will be placed down your throat to help you breathe during the procedure. Your abdomen will be filled with an air-like gas so that your abdomen expands. This will give the surgeon more room to operate and will make your organs easier to see. Many small incisions will be made in your abdomen. A laparoscope and other surgical instruments will be inserted into your abdomen through these incisions. A biopsy may be done.   This will depend on the reason why you are having this procedure. The laparoscope and other instruments will be removed from your abdomen. The air-like gas will be released from your abdomen. Your incisions will be closed with stitches  (sutures), skin glue, or surgical tapes and covered with a bandage (dressing). Your breathing tube will be removed. The procedure may vary among health care providers and hospitals. What happens after the procedure? Your blood pressure, heart rate, breathing rate, and blood oxygen level will be monitored until you leave the hospital or clinic. If you were given a sedative during the procedure, it can affect you for several hours. Do not drive or operate machinery until your health care provider says that it is safe. It is up to you to get the results of your procedure. Ask your health care provider, or the department that is doing the procedure, when your results will be ready. Summary Diagnostic laparoscopy is a procedure to diagnose problems in the abdomen using a thin, flexible tube that has a light and a camera on the end (laparoscope). Follow instructions from your health care provider about how to prepare for the procedure. Plan to have a responsible adult care for you for the time you are told after you leave the hospital or clinic. This is important. This information is not intended to replace advice given to you by your health care provider. Make sure you discuss any questions you have with your health care provider. Document Revised: 01/17/2020 Document Reviewed: 01/17/2020 Elsevier Patient Education  2022 [employer]  

## 2021-02-19 ENCOUNTER — Other Ambulatory Visit: Payer: Self-pay

## 2021-02-19 ENCOUNTER — Encounter
Admission: RE | Admit: 2021-02-19 | Discharge: 2021-02-19 | Disposition: A | Discharge status: Home / Self Care | Payer: Self-pay | Source: Ambulatory Visit | Attending: Obstetrics & Gynecology | Admitting: Obstetrics & Gynecology

## 2021-02-19 HISTORY — DX: Anxiety disorder, unspecified: F41.9

## 2021-02-19 HISTORY — DX: Unspecified asthma, uncomplicated: J45.909

## 2021-02-19 NOTE — Patient Instructions (Signed)
Your procedure is scheduled on:02-25-21 Thursday Report to the Registration Desk on the 1st floor of the Medical Mall.Then proceed to the 2nd floor Surgery Desk in the Medical Mall To find out your arrival time, please call [phone] between 1PM - 3PM on:02-24-21 Wednesday  REMEMBER: Instructions that are not followed completely may result in serious medical risk, up to and including death; or upon the discretion of your surgeon and anesthesiologist your surgery may need to be rescheduled.  Do not eat food after midnight the night before surgery.  No gum chewing, lozengers or hard candies.  You may however, drink CLEAR liquids up to 2 hours before you are scheduled to arrive for your surgery. Do not drink anything within 2 hours of your scheduled arrival time.  Clear liquids include: - water  - apple juice without pulp - gatorade (not RED, PURPLE, OR BLUE) - black coffee or tea (Do NOT add milk or creamers to the coffee or tea) Do NOT drink anything that is not on this list.  TAKE THESE MEDICATIONS THE MORNING OF SURGERY WITH A SIP OF WATER: -You may take Tylenol for pain if needed the day of surgery  One week prior to surgery: Stop Anti-inflammatories (NSAIDS) such as Advil, Aleve, Ibuprofen, Motrin, Naproxen, Naprosyn and Aspirin based products such as Excedrin, Goodys Powder, BC Powder.You may however, continue to take Tylenol if needed for pain  Stop ANY OVER THE COUNTER supplements/vitamins NOW (02-19-21) until after surgery.  No Alcohol for 24 hours before or after surgery.  No Smoking including e-cigarettes for 24 hours prior to surgery.  No chewable tobacco products for at least 6 hours prior to surgery.  No nicotine patches on the day of surgery.  Do not use any "recreational" drugs for at least a week prior to your surgery.  Please be advised that the combination of cocaine and anesthesia may have negative outcomes, up to and including death. If you test positive for  cocaine, your surgery will be cancelled.  On the morning of surgery brush your teeth with toothpaste and water, you may rinse your mouth with mouthwash if you wish. Do not swallow any toothpaste or mouthwash.  Use CHG Soap as directed on instruction sheet.  Do not wear jewelry, make-up, hairpins, clips or nail polish.  Do not wear lotions, powders, or perfumes.   Do not shave body from the neck down 48 hours prior to surgery just in case you cut yourself which could leave a site for infection.  Also, freshly shaved skin may become irritated if using the CHG soap.  Contact lenses, hearing aids and dentures may not be worn into surgery.  Do not bring valuables to the hospital. [facility] is not responsible for any missing/lost belongings or valuables.   Notify your doctor if there is any change in your medical condition (cold, fever, infection).  Wear comfortable clothing (specific to your surgery type) to the hospital.  After surgery, you can help prevent lung complications by doing breathing exercises.  Take deep breaths and cough every 1-2 hours. Your doctor may order a device called an Incentive Spirometer to help you take deep breaths. When coughing or sneezing, hold a pillow firmly against your incision with both hands. This is called "splinting." Doing this helps protect your incision. It also decreases belly discomfort.  If you are being admitted to the hospital overnight, leave your suitcase in the car. After surgery it may be brought to your room.  If you are being  discharged the day of surgery, you will not be allowed to drive home. You will need a responsible adult (18 years or older) to drive you home and stay with you that night.   If you are taking public transportation, you will need to have a responsible adult (18 years or older) with you. Please confirm with your physician that it is acceptable to use public transportation.   Please call the Pre-admissions Testing  Dept. at [phone] if you have any questions about these instructions.  Surgery Visitation Policy:  Patients undergoing a surgery or procedure may have one family member or support person with them as long as that person is not COVID-19 positive or experiencing its symptoms.  That person may remain in the waiting area during the procedure.  Inpatient Visitation:    Visiting hours are 7 a.m. to 8 p.m. Inpatients will be allowed two visitors daily. The visitors may change each day during the patient's stay. No visitors under the age of 61. Any visitor under the age of 66 must be accompanied by an adult. The visitor must pass COVID-19 screenings, use hand sanitizer when entering and exiting the patient's room and wear a mask at all times, including in the patient's room. Patients must also wear a mask when staff or their visitor are in the room. Masking is required regardless of vaccination status.

## 2021-02-22 ENCOUNTER — Encounter
Admission: RE | Admit: 2021-02-22 | Discharge: 2021-02-22 | Disposition: A | Discharge status: Home / Self Care | Payer: Self-pay | Source: Ambulatory Visit | Attending: Obstetrics & Gynecology | Admitting: Obstetrics & Gynecology

## 2021-02-22 ENCOUNTER — Other Ambulatory Visit: Payer: Self-pay

## 2021-02-22 DIAGNOSIS — Z01812 Encounter for preprocedural laboratory examination: Secondary | ICD-10-CM | POA: Insufficient documentation

## 2021-02-22 LAB — CBC
HCT: 37.5 % (ref 36.0–46.0)
Hemoglobin: 13.7 g/dL (ref 12.0–15.0)
MCH: 30.9 pg (ref 26.0–34.0)
MCHC: 36.5 g/dL — ABNORMAL HIGH (ref 30.0–36.0)
MCV: 84.7 fL (ref 80.0–100.0)
Platelets: 181 10*3/uL (ref 150–400)
RBC: 4.43 MIL/uL (ref 3.87–5.11)
RDW: 11.5 % (ref 11.5–15.5)
WBC: 6.2 10*3/uL (ref 4.0–10.5)
nRBC: 0 % (ref 0.0–0.2)

## 2021-02-24 ENCOUNTER — Ambulatory Visit: Payer: Federal, State, Local not specified - PPO | Admitting: Obstetrics & Gynecology

## 2021-02-24 LAB — TYPE AND SCREEN
ABO/RH(D): O POS
Antibody Screen: NEGATIVE

## 2021-02-25 ENCOUNTER — Ambulatory Visit
Admission: RE | Admit: 2021-02-25 | Discharge: 2021-02-25 | Disposition: A | Discharge status: Home / Self Care | Payer: Self-pay | Attending: Obstetrics & Gynecology | Admitting: Obstetrics & Gynecology

## 2021-02-25 ENCOUNTER — Encounter
Admission: RE | Disposition: A | Discharge status: Home / Self Care | Payer: Self-pay | Source: Home / Self Care | Attending: Obstetrics & Gynecology

## 2021-02-25 ENCOUNTER — Encounter: Payer: Self-pay | Admitting: Obstetrics & Gynecology

## 2021-02-25 ENCOUNTER — Ambulatory Visit: Payer: Self-pay | Admitting: Anesthesiology

## 2021-02-25 DIAGNOSIS — Z793 Long term (current) use of hormonal contraceptives: Secondary | ICD-10-CM | POA: Insufficient documentation

## 2021-02-25 DIAGNOSIS — Z882 Allergy status to sulfonamides status: Secondary | ICD-10-CM | POA: Insufficient documentation

## 2021-02-25 DIAGNOSIS — R102 Pelvic and perineal pain: Secondary | ICD-10-CM

## 2021-02-25 HISTORY — PX: LAPAROSCOPY: SHX197

## 2021-02-25 LAB — POCT PREGNANCY, URINE: Preg Test, Ur: NEGATIVE

## 2021-02-25 LAB — ABO/RH: ABO/RH(D): O POS

## 2021-02-25 SURGERY — LAPAROSCOPY, DIAGNOSTIC
Anesthesia: General | Site: Pelvis

## 2021-02-25 MED ORDER — LIDOCAINE HCL (CARDIAC) PF 100 MG/5ML IV SOSY
PREFILLED_SYRINGE | INTRAVENOUS | Status: DC | PRN
  Administered 2021-02-25: 100 mg via INTRAVENOUS

## 2021-02-25 MED ORDER — PROPOFOL 10 MG/ML IV BOLUS
INTRAVENOUS | Status: AC
  Filled 2021-02-25: qty 20

## 2021-02-25 MED ORDER — LACTATED RINGERS IV SOLN: INTRAVENOUS | Status: DC

## 2021-02-25 MED ORDER — FENTANYL CITRATE (PF) 100 MCG/2ML IJ SOLN
INTRAMUSCULAR | Status: AC
  Filled 2021-02-25: qty 2

## 2021-02-25 MED ORDER — BUPIVACAINE HCL (PF) 0.5 % IJ SOLN
INTRAMUSCULAR | Status: AC
  Filled 2021-02-25: qty 30

## 2021-02-25 MED ORDER — ONDANSETRON HCL 4 MG/2ML IJ SOLN
INTRAMUSCULAR | Status: AC
  Filled 2021-02-25: qty 2

## 2021-02-25 MED ORDER — PROPOFOL 10 MG/ML IV BOLUS
INTRAVENOUS | Status: DC | PRN
  Administered 2021-02-25: 100 mg via INTRAVENOUS
  Administered 2021-02-25: 30 mg via INTRAVENOUS

## 2021-02-25 MED ORDER — PROMETHAZINE HCL 25 MG/ML IJ SOLN: 6.2500 mg | INTRAMUSCULAR | Status: DC | PRN

## 2021-02-25 MED ORDER — ACETAMINOPHEN 10 MG/ML IV SOLN
INTRAVENOUS | Status: AC
  Filled 2021-02-25: qty 100

## 2021-02-25 MED ORDER — FAMOTIDINE 20 MG PO TABS
ORAL_TABLET | ORAL | Status: AC
  Administered 2021-02-25: 20 mg via ORAL
  Filled 2021-02-25: qty 1

## 2021-02-25 MED ORDER — DEXMEDETOMIDINE (PRECEDEX) IN NS 20 MCG/5ML (4 MCG/ML) IV SYRINGE
PREFILLED_SYRINGE | INTRAVENOUS | Status: DC | PRN
  Administered 2021-02-25: 8 ug via INTRAVENOUS

## 2021-02-25 MED ORDER — ACETAMINOPHEN 650 MG RE SUPP: 650.0000 mg | RECTAL | Status: DC | PRN

## 2021-02-25 MED ORDER — BUPIVACAINE HCL (PF) 0.5 % IJ SOLN
INTRAMUSCULAR | Status: DC | PRN
  Administered 2021-02-25: 9 mL

## 2021-02-25 MED ORDER — MORPHINE SULFATE (PF) 2 MG/ML IV SOLN
1.0000 mg | INTRAVENOUS | Status: DC | PRN
Start: 2021-02-25 — End: 2021-02-25

## 2021-02-25 MED ORDER — SUGAMMADEX SODIUM 200 MG/2ML IV SOLN
INTRAVENOUS | Status: DC | PRN
  Administered 2021-02-25: 200 mg via INTRAVENOUS

## 2021-02-25 MED ORDER — MIDAZOLAM HCL 2 MG/2ML IJ SOLN
INTRAMUSCULAR | Status: DC | PRN
  Administered 2021-02-25: 1 mg via INTRAVENOUS

## 2021-02-25 MED ORDER — ROCURONIUM BROMIDE 10 MG/ML (PF) SYRINGE
PREFILLED_SYRINGE | INTRAVENOUS | Status: AC
  Filled 2021-02-25: qty 10

## 2021-02-25 MED ORDER — ORAL CARE MOUTH RINSE: 15.0000 mL | Freq: Once | OROMUCOSAL | Status: AC

## 2021-02-25 MED ORDER — PHENYLEPHRINE HCL (PRESSORS) 10 MG/ML IV SOLN
INTRAVENOUS | Status: DC | PRN
  Administered 2021-02-25: 200 ug via INTRAVENOUS

## 2021-02-25 MED ORDER — OXYCODONE HCL 5 MG PO TABS
5.0000 mg | ORAL_TABLET | ORAL | Status: AC
  Administered 2021-02-25: 5 mg via ORAL

## 2021-02-25 MED ORDER — CHLORHEXIDINE GLUCONATE 0.12 % MT SOLN: 15.0000 mL | Freq: Once | OROMUCOSAL | Status: AC

## 2021-02-25 MED ORDER — FENTANYL CITRATE (PF) 100 MCG/2ML IJ SOLN
25.0000 ug | INTRAMUSCULAR | Status: DC | PRN
  Administered 2021-02-25 (×2): 25 ug via INTRAVENOUS

## 2021-02-25 MED ORDER — ACETAMINOPHEN 10 MG/ML IV SOLN
INTRAVENOUS | Status: DC | PRN
  Administered 2021-02-25: 1000 mg via INTRAVENOUS

## 2021-02-25 MED ORDER — FENTANYL CITRATE (PF) 100 MCG/2ML IJ SOLN
INTRAMUSCULAR | Status: AC
  Administered 2021-02-25: 25 ug via INTRAVENOUS
  Filled 2021-02-25: qty 2

## 2021-02-25 MED ORDER — APREPITANT 40 MG PO CAPS
ORAL_CAPSULE | ORAL | Status: AC
  Filled 2021-02-25: qty 1

## 2021-02-25 MED ORDER — 0.9 % SODIUM CHLORIDE (POUR BTL) OPTIME
TOPICAL | Status: DC | PRN
  Administered 2021-02-25: 500 mL

## 2021-02-25 MED ORDER — OXYCODONE-ACETAMINOPHEN 5-325 MG PO TABS: 1.0000 | ORAL_TABLET | ORAL | 0 refills | Status: DC | PRN

## 2021-02-25 MED ORDER — DEXAMETHASONE SODIUM PHOSPHATE 10 MG/ML IJ SOLN
INTRAMUSCULAR | Status: DC | PRN
  Administered 2021-02-25: 6 mg via INTRAVENOUS

## 2021-02-25 MED ORDER — OXYCODONE HCL 5 MG PO TABS
ORAL_TABLET | ORAL | Status: AC
  Filled 2021-02-25: qty 1

## 2021-02-25 MED ORDER — FAMOTIDINE 20 MG PO TABS: 20.0000 mg | ORAL_TABLET | Freq: Once | ORAL | Status: AC

## 2021-02-25 MED ORDER — PROPOFOL 500 MG/50ML IV EMUL
INTRAVENOUS | Status: DC | PRN
  Administered 2021-02-25: 25 ug/kg/min via INTRAVENOUS

## 2021-02-25 MED ORDER — FENTANYL CITRATE (PF) 100 MCG/2ML IJ SOLN
INTRAMUSCULAR | Status: DC | PRN
  Administered 2021-02-25 (×2): 50 ug via INTRAVENOUS

## 2021-02-25 MED ORDER — ROCURONIUM BROMIDE 10 MG/ML (PF) SYRINGE
PREFILLED_SYRINGE | INTRAVENOUS | Status: DC | PRN
  Administered 2021-02-25: 40 mg via INTRAVENOUS

## 2021-02-25 MED ORDER — DEXAMETHASONE SODIUM PHOSPHATE 10 MG/ML IJ SOLN
INTRAMUSCULAR | Status: AC
  Filled 2021-02-25: qty 1

## 2021-02-25 MED ORDER — CHLORHEXIDINE GLUCONATE 0.12 % MT SOLN
OROMUCOSAL | Status: AC
  Administered 2021-02-25: 15 mL via OROMUCOSAL
  Filled 2021-02-25: qty 15

## 2021-02-25 MED ORDER — POVIDONE-IODINE 10 % EX SWAB: 2.0000 "application " | Freq: Once | CUTANEOUS | Status: DC

## 2021-02-25 MED ORDER — ONDANSETRON HCL 4 MG/2ML IJ SOLN
INTRAMUSCULAR | Status: DC | PRN
  Administered 2021-02-25: 4 mg via INTRAVENOUS

## 2021-02-25 MED ORDER — ACETAMINOPHEN 325 MG PO TABS: 650.0000 mg | ORAL_TABLET | ORAL | Status: DC | PRN

## 2021-02-25 MED ORDER — DEXMEDETOMIDINE (PRECEDEX) IN NS 20 MCG/5ML (4 MCG/ML) IV SYRINGE
PREFILLED_SYRINGE | INTRAVENOUS | Status: AC
  Filled 2021-02-25: qty 5

## 2021-02-25 MED ORDER — APREPITANT 40 MG PO CAPS
40.0000 mg | ORAL_CAPSULE | Freq: Once | ORAL | Status: AC
  Administered 2021-02-25: 40 mg via ORAL

## 2021-02-25 MED ORDER — OXYCODONE-ACETAMINOPHEN 5-325 MG PO TABS
1.0000 | ORAL_TABLET | ORAL | Status: DC | PRN
Start: 2021-02-25 — End: 2021-02-25

## 2021-02-25 MED ORDER — MIDAZOLAM HCL 2 MG/2ML IJ SOLN
INTRAMUSCULAR | Status: AC
  Filled 2021-02-25: qty 4

## 2021-02-25 SURGICAL SUPPLY — 42 items
BLADE SURG SZ11 CARB STEEL (BLADE) ×2 IMPLANT
CATH ROBINSON RED A/P 16FR (CATHETERS) ×2 IMPLANT
CHLORAPREP W/TINT 26 (MISCELLANEOUS) ×2 IMPLANT
DERMABOND ADVANCED (GAUZE/BANDAGES/DRESSINGS) ×1
DERMABOND ADVANCED .7 DNX12 (GAUZE/BANDAGES/DRESSINGS) ×1 IMPLANT
DRSG TEGADERM 2-3/8X2-3/4 SM (GAUZE/BANDAGES/DRESSINGS) ×4 IMPLANT
DRSG TELFA 4X3 1S NADH ST (GAUZE/BANDAGES/DRESSINGS) IMPLANT
GAUZE 4X4 16PLY ~~LOC~~+RFID DBL (SPONGE) ×4 IMPLANT
GLOVE SURG ENC MOIS LTX SZ8 (GLOVE) ×2 IMPLANT
GLOVE SURG UNDER LTX SZ8 (GLOVE) ×2 IMPLANT
GOWN STRL REUS W/ TWL LRG LVL3 (GOWN DISPOSABLE) ×1 IMPLANT
GOWN STRL REUS W/ TWL XL LVL3 (GOWN DISPOSABLE) ×1 IMPLANT
GOWN STRL REUS W/TWL LRG LVL3 (GOWN DISPOSABLE) ×1
GOWN STRL REUS W/TWL XL LVL3 (GOWN DISPOSABLE) ×1
GRASPER SUT TROCAR 14GX15 (MISCELLANEOUS) ×2 IMPLANT
IRRIGATION STRYKERFLOW (MISCELLANEOUS) IMPLANT
IRRIGATOR STRYKERFLOW (MISCELLANEOUS)
IV LACTATED RINGERS 1000ML (IV SOLUTION) IMPLANT
KIT PINK PAD W/HEAD ARE REST (MISCELLANEOUS) ×2
KIT PINK PAD W/HEAD ARM REST (MISCELLANEOUS) ×1 IMPLANT
LABEL OR SOLS (LABEL) ×2 IMPLANT
MANIFOLD NEPTUNE II (INSTRUMENTS) ×2 IMPLANT
NEEDLE VERESS 14GA 120MM (NEEDLE) ×2 IMPLANT
NS IRRIG 500ML POUR BTL (IV SOLUTION) ×2 IMPLANT
PACK GYN LAPAROSCOPIC (MISCELLANEOUS) ×2 IMPLANT
PAD PREP 24X41 OB/GYN DISP (PERSONAL CARE ITEMS) ×2 IMPLANT
POUCH SPECIMEN RETRIEVAL 10MM (ENDOMECHANICALS) IMPLANT
SCISSORS METZENBAUM CVD 33 (INSTRUMENTS) ×2 IMPLANT
SCRUB EXIDINE 4% CHG 4OZ (MISCELLANEOUS) ×2 IMPLANT
SET TUBE SMOKE EVAC HIGH FLOW (TUBING) ×2 IMPLANT
SHEARS HARMONIC ACE PLUS 36CM (ENDOMECHANICALS) IMPLANT
SLEEVE ENDOPATH XCEL 5M (ENDOMECHANICALS) IMPLANT
SOL PREP POV-IOD 4OZ 10% (MISCELLANEOUS) ×2 IMPLANT
SOL PREP PROV IODINE SCRUB 4OZ (MISCELLANEOUS) IMPLANT
SPONGE GAUZE 2X2 8PLY STRL LF (GAUZE/BANDAGES/DRESSINGS) ×4 IMPLANT
STRAP SAFETY 5IN WIDE (MISCELLANEOUS) ×2 IMPLANT
SUT VIC AB 2-0 UR6 27 (SUTURE) IMPLANT
SUT VIC AB 4-0 PS2 18 (SUTURE) IMPLANT
SYR 10ML LL (SYRINGE) ×2 IMPLANT
TROCAR ENDO BLADELESS 11MM (ENDOMECHANICALS) IMPLANT
TROCAR XCEL NON-BLD 5MMX100MML (ENDOMECHANICALS) ×2 IMPLANT
WATER STERILE IRR 500ML POUR (IV SOLUTION) IMPLANT

## 2021-02-25 NOTE — Discharge Instructions (Signed)

## 2021-02-25 NOTE — Anesthesia Postprocedure Evaluation (Signed)
Anesthesia Post Note  Patient: Jennifer Francis  Procedure(s) Performed: LAPAROSCOPY DIAGNOSTIC (Pelvis)  Patient location during evaluation: PACU Anesthesia Type: General Level of consciousness: awake and alert Pain management: pain level controlled Vital Signs Assessment: post-procedure vital signs reviewed and stable Respiratory status: spontaneous breathing, nonlabored ventilation, respiratory function stable and patient connected to nasal cannula oxygen Cardiovascular status: blood pressure returned to baseline and stable Postop Assessment: no apparent nausea or vomiting Anesthetic complications: no   No notable events documented.   Last Vitals:  Vitals:   02/25/21 1300 02/25/21 1318  BP: (!) 105/51 (!) 107/46  Pulse: 78 72  Resp: 11 18  Temp:  (!) 36 C  SpO2: 100% 99%    Last Pain:  Vitals:   02/25/21 1318  TempSrc: Temporal  PainSc: 4                  Lenard Simmer

## 2021-02-25 NOTE — Interval H&P Note (Signed)
History and Physical Interval Note:  02/25/2021 9:11 AM  Jennifer Francis  has presented today for surgery, with the diagnosis of Pelvic pain.  The various methods of treatment have been discussed with the patient and family. After consideration of risks, benefits and other options for treatment, the patient has consented to  Procedure(s): LAPAROSCOPY DIAGNOSTIC (N/A) as a surgical intervention.  The patient's history has been reviewed, patient examined, no change in status, stable for surgery.  I have reviewed the patient's chart and labs.  Questions were answered to the patient's satisfaction.     Letitia Libra

## 2021-02-25 NOTE — Transfer of Care (Signed)
Immediate Anesthesia Transfer of Care Note  Patient: Jennifer Francis  Procedure(s) Performed: LAPAROSCOPY DIAGNOSTIC (Pelvis)  Patient Location: PACU  Anesthesia Type:General  Level of Consciousness: drowsy  Airway & Oxygen Therapy: Patient Spontanous Breathing and Patient connected to face mask oxygen  Post-op Assessment: Report given to RN and Post -op Vital signs reviewed and stable  Post vital signs: Reviewed and stable  Last Vitals:  Vitals Value Taken Time  BP 108/58 02/25/21 1222  Temp    Pulse 59 02/25/21 1223  Resp 24 02/25/21 1223  SpO2 100 % 02/25/21 1223  Vitals shown include unvalidated device data.  Last Pain:  Vitals:   02/25/21 0916  TempSrc: Temporal  PainSc: 6          Complications: No notable events documented.

## 2021-02-25 NOTE — Anesthesia Procedure Notes (Signed)
Procedure Name: Intubation Date/Time: 02/25/2021 11:27 AM Performed by: Riccardo Dubin, CRNA Pre-anesthesia Checklist: Patient identified, Patient being monitored, Timeout performed, Emergency Drugs available and Suction available Patient Re-evaluated:Patient Re-evaluated prior to induction Oxygen Delivery Method: Circle system utilized Preoxygenation: Pre-oxygenation with 100% oxygen Induction Type: IV induction Ventilation: Mask ventilation without difficulty Laryngoscope Size: 3 and McGraph Grade View: Grade I Tube type: Oral Tube size: 6.5 mm Number of attempts: 1 Airway Equipment and Method: Stylet Placement Confirmation: ETT inserted through vocal cords under direct vision, positive ETCO2 and breath sounds checked- equal and bilateral Secured at: 21 cm Tube secured with: Tape Dental Injury: Teeth and Oropharynx as per pre-operative assessment

## 2021-02-25 NOTE — Op Note (Signed)
  Operative Note   02/25/2021  PRE-OP DIAGNOSIS: Pelvic pain    POST-OP DIAGNOSIS: same   PROCEDURE: Procedure(s): LAPAROSCOPY DIAGNOSTIC   SURGEON: Annamarie Major, MD, FACOG  ANESTHESIA: General   ESTIMATED BLOOD LOSS: Min  COMPLICATIONS: non  DISPOSITION: PACU - hemodynamically stable.  CONDITION: stable  FINDINGS: Laparoscopic survey of the abdomen revealed a grossly normal uterus, tubes, ovaries, liver edge, gallbladder edge and appendix, No intra-abdominal adhesions were noted. No evidence for endometriosis, pelvic congestion, adhesions, or PID.  PROCEDURE IN DETAIL: The patient was taken to the OR where anesthesia was administed. The patient was positioned in dorsal lithotomy in the [place] stirrups. The patient was then examined under anesthesia with the above noted findings. The patient was prepped and draped in the normal sterile fashion and foley catheter was placed. A Graves speculum was placed in the vagina and the anterior lip of the cervix was grasped with a single toothed tenaculum. A [place] uterine manipulator was then inserted in the uterus. Uterine mobility was found to be satisfactory. The speculum was then removed.  Attention was turned to the patient's abdomen where a 5 mm skin incision was made in the umbilical fold, after injection of local anesthesia. The Veress step needle was carefully introduced into the peritoneal cavity with placement confirmed using the hanging drop technique.  Pneumoperitoneum was obtained. The 5 mm port was then placed under direct visualization with the operative laparoscope.  Trendelenburg positioning.  Additional 5 mm trocar was then placed in the suprapubic region under direct visualization with the laparoscope.  Instrumentation to visualize complete pelvic anatomy performed.  No abnormalities seen.  No evidence for endometriosis.  Instruments and trocars removed, gas expelled, and skin closed with skin adhesive glue.  Instrument, needle,  and sponge counts correct x2 at the conclusion of the case.  Pt goes to recovery room in stable condition.  Annamarie Major, MD, [facility], [facility] 02/25/2021  12:24 PM

## 2021-02-25 NOTE — Anesthesia Preprocedure Evaluation (Signed)
Anesthesia Evaluation  Patient identified by MRN, date of birth, ID band Patient awake    Reviewed: Allergy & Precautions, H&P , NPO status , Patient's Chart, lab work & pertinent test results, reviewed documented beta blocker date and time   History of Anesthesia Complications (+) Family history of anesthesia reaction and history of anesthetic complications  Airway Mallampati: I  TM Distance: >3 FB Neck ROM: full    Dental  (+) Dental Advidsory Given, Teeth Intact   Pulmonary neg shortness of breath, asthma , neg sleep apnea, neg recent URI,    Pulmonary exam normal breath sounds clear to auscultation       Cardiovascular Exercise Tolerance: Good negative cardio ROS Normal cardiovascular exam Rhythm:regular Rate:Normal     Neuro/Psych negative neurological ROS  negative psych ROS   GI/Hepatic negative GI ROS, Neg liver ROS,   Endo/Other  negative endocrine ROS  Renal/GU negative Renal ROS  negative genitourinary   Musculoskeletal   Abdominal   Peds  Hematology negative hematology ROS (+)   Anesthesia Other Findings Past Medical History: No date: Anxiety     Comment:  no meds No date: Asthma     Comment:  no inhalers-well controlled No date: Heavy periods No date: Irregular periods No date: UTI (urinary tract infection)   Reproductive/Obstetrics negative OB ROS                             Anesthesia Physical Anesthesia Plan  ASA: 2  Anesthesia Plan: General   Post-op Pain Management:    Induction: Intravenous  PONV Risk Score and Plan: 3 and Ondansetron, Dexamethasone, Midazolam, Treatment may vary due to age or medical condition and Promethazine  Airway Management Planned: Oral ETT  Additional Equipment:   Intra-op Plan:   Post-operative Plan: Extubation in OR  Informed Consent: I have reviewed the patients History and Physical, chart, labs and discussed the  procedure including the risks, benefits and alternatives for the proposed anesthesia with the patient or authorized representative who has indicated his/her understanding and acceptance.     Dental Advisory Given  Plan Discussed with: Anesthesiologist, CRNA and Surgeon  Anesthesia Plan Comments:         Anesthesia Quick Evaluation

## 2021-02-26 ENCOUNTER — Encounter: Payer: Self-pay | Admitting: Obstetrics & Gynecology

## 2021-02-26 ENCOUNTER — Other Ambulatory Visit: Payer: Self-pay | Admitting: Obstetrics & Gynecology

## 2021-03-02 ENCOUNTER — Telehealth: Payer: Self-pay

## 2021-03-02 NOTE — Telephone Encounter (Signed)
Contacted patient via phone. Advised patient we have an opening Thursday, 03/04/21 at 4:30. Patient states she wasn't able to come in. Next offer patient agree to 03/09/21 at 2:50 with [facility]

## 2021-03-02 NOTE — Telephone Encounter (Signed)
Pt calling; had lap done 9/22nd; was told to call if has pain; pain med not working; has appt 10/5th; can it be moved up?  [phone]

## 2021-03-03 NOTE — Telephone Encounter (Signed)
Patient is scheduled for 03/09/21 with [facility]

## 2021-03-04 ENCOUNTER — Other Ambulatory Visit: Payer: Self-pay

## 2021-03-04 ENCOUNTER — Ambulatory Visit (INDEPENDENT_AMBULATORY_CARE_PROVIDER_SITE_OTHER): Payer: Self-pay | Admitting: Obstetrics & Gynecology

## 2021-03-04 ENCOUNTER — Encounter: Payer: Self-pay | Admitting: Obstetrics & Gynecology

## 2021-03-04 VITALS — BP 120/80 | Ht 62.0 in | Wt 123.0 lb

## 2021-03-04 DIAGNOSIS — R102 Pelvic and perineal pain: Secondary | ICD-10-CM

## 2021-03-04 DIAGNOSIS — R198 Other specified symptoms and signs involving the digestive system and abdomen: Secondary | ICD-10-CM

## 2021-03-04 MED ORDER — MEDROXYPROGESTERONE ACETATE 150 MG/ML IM SUSP: 150.0000 mg | INTRAMUSCULAR | 4 refills | Status: AC

## 2021-03-04 NOTE — Progress Notes (Signed)
Gynecology Pelvic Pain Evaluation   Chief Complaint  Patient presents with   Routine Post Op    Lower abd pain    History of Present Illness:   Patient is a 20 y.o. G0P0000 who LMP was No LMP recorded. Patient has had an injection., presents today for a problem visit.  She complains of pain.    She has 2 pains ONE is associated with intermittent bouts of diarrhea then constipation, and she feels spasms like ctxs.   Early mornings especially, but now can be all day.  Normal CT, [place], and Dx Lap for Gyn etiologies (no endometriosis, adhesions, PID, cysts).  SECOND is suprapubic and groin, feels like a tearing sensation, no radiation or associated findings here.  Misses work for pain at times.   PMHx: She  has a past medical history of Anxiety, Asthma, Heavy periods, Irregular periods, and UTI (urinary tract infection). Also,  has a past surgical history that includes No past surgeries and laparoscopy (N/A, 02/25/2021)., family history includes Breast cancer in her maternal grandmother; Diabetes in her maternal grandmother; Hypertension in her maternal grandfather.,  reports that she has never smoked. She has never used smokeless tobacco. She reports that she does not drink alcohol and does not use drugs.  She has a current medication list which includes the following prescription(s): acetaminophen, naproxen, and medroxyprogesterone. Also, is allergic to sulfa antibiotics.  Review of Systems  Constitutional:  Negative for chills, fever and malaise/fatigue.  HENT:  Negative for congestion, sinus pain and sore throat.   Eyes:  Negative for blurred vision and pain.  Respiratory:  Negative for cough and wheezing.   Cardiovascular:  Negative for chest pain and leg swelling.  Gastrointestinal:  Positive for abdominal pain, constipation and diarrhea. Negative for heartburn, nausea and vomiting.  Genitourinary:  Negative for dysuria, frequency, hematuria and urgency.  Musculoskeletal:  Negative  for back pain, joint pain, myalgias and neck pain.  Skin:  Negative for itching and rash.  Neurological:  Negative for dizziness, tremors and weakness.  Endo/Heme/Allergies:  Does not bruise/bleed easily.  Psychiatric/Behavioral:  Negative for depression. The patient is not nervous/anxious and does not have insomnia.    Objective: BP 120/80   Ht 5\' 2"  (1.575 m)   Wt 123 lb (55.8 kg)   BMI 22.50 kg/m  Physical Exam Constitutional:      General: She is not in acute distress.    Appearance: She is well-developed.  Cardiovascular:     Rate and Rhythm: Normal rate.  Pulmonary:     Effort: Pulmonary effort is normal.  Abdominal:     General: There is no distension.     Palpations: Abdomen is soft.     Tenderness: There is no abdominal tenderness.     Comments: [facility]   Musculoskeletal:        General: Normal range of motion.  Neurological:     Mental Status: She is alert and oriented to person, place, and time.     Cranial Nerves: No cranial nerve deficit.  Skin:    General: Skin is warm and dry.    Female chaperone present for pelvic portion of the physical exam  Assessment: 20 y.o. G0P0000  1. Pelvic pain - Ambulatory referral to Physical Therapy  2. Alternating constipation and diarrhea - Ambulatory referral to Gastroenterology  NSAIDs, heat, rest Cont to look for clear etiology  Cont Depo q 3 mos   12, MD, Annamarie Major Ob/Gyn, [facility]  03/04/2021  4:40 PM

## 2021-03-08 ENCOUNTER — Telehealth: Payer: Self-pay | Admitting: Obstetrics & Gynecology

## 2021-03-08 ENCOUNTER — Ambulatory Visit: Payer: Self-pay

## 2021-03-08 NOTE — Telephone Encounter (Signed)
Called pt to reschedule her Depo injection.  Left message for the patent to call the office.

## 2021-03-09 ENCOUNTER — Ambulatory Visit: Payer: Self-pay | Admitting: Obstetrics & Gynecology

## 2021-03-10 ENCOUNTER — Ambulatory Visit: Payer: Self-pay | Admitting: Obstetrics & Gynecology

## 2021-03-22 ENCOUNTER — Ambulatory Visit: Payer: Medicaid Other | Attending: Obstetrics & Gynecology

## 2021-03-22 ENCOUNTER — Other Ambulatory Visit: Payer: Self-pay

## 2021-03-22 DIAGNOSIS — R2689 Other abnormalities of gait and mobility: Secondary | ICD-10-CM | POA: Insufficient documentation

## 2021-03-22 DIAGNOSIS — R278 Other lack of coordination: Secondary | ICD-10-CM | POA: Insufficient documentation

## 2021-03-22 DIAGNOSIS — G8929 Other chronic pain: Secondary | ICD-10-CM | POA: Diagnosis present

## 2021-03-22 DIAGNOSIS — M25551 Pain in right hip: Secondary | ICD-10-CM | POA: Diagnosis present

## 2021-03-22 DIAGNOSIS — R159 Full incontinence of feces: Secondary | ICD-10-CM | POA: Diagnosis present

## 2021-03-22 DIAGNOSIS — M25552 Pain in left hip: Secondary | ICD-10-CM | POA: Diagnosis present

## 2021-03-22 DIAGNOSIS — M545 Low back pain, unspecified: Secondary | ICD-10-CM | POA: Diagnosis not present

## 2021-03-22 NOTE — Therapy (Signed)
[place] [facility] MAIN [facility] SERVICES [address] [place], [place], [address] Phone: [phone]   Fax:  [phone]  Physical Therapy Evaluation  Patient Details  Name: Jennifer Francis MRN: [id number] Date of Birth: 24-Oct-2000 No data recorded  Encounter Date: 03/22/2021   PT End of Session - 03/22/21 1634     Visit Number 1    Number of Visits 10    Date for PT Re-Evaluation 05/31/21    Authorization Type None noted    Progress Note Due on Visit 10    PT Start Time 1517   pt late   PT Stop Time 1604    PT Time Calculation (min) 47 min    Activity Tolerance Patient tolerated treatment well    Behavior During Therapy [facility] for tasks assessed/performed             Past Medical History:  Diagnosis Date   Anxiety    no meds   Asthma    no inhalers-well controlled   Heavy periods    Irregular periods    UTI (urinary tract infection)     Past Surgical History:  Procedure Laterality Date   LAPAROSCOPY N/A 02/25/2021   Procedure: LAPAROSCOPY DIAGNOSTIC;  Surgeon: Nadara Mustard, MD;  Location: [facility] ORS;  Service: Gynecology;  Laterality: N/A;   NO PAST SURGERIES      There were no vitals filed for this visit.    Subjective Assessment - 03/22/21 1523     Subjective Pt reported pelvic pain started 02/12/2021 and had to stop working 2/2 pain and had a laparascopy procedure on 02/25/21. Pain radiates to buttocks and LEs. Pain started without any falls or accidents. At worst: 9/10, on average: 8/10, at best: 5/10. She also gets hot flashes, fevers and passes out, lightheadedness. Her skin is also dry. More activity makes pain worse, medications do decr. pain. Pt reports pain with insertion and it doesn't make a difference if she uses lubricant, OBGYN exam is also painful and pt does not use tampons as that is also painful. Pt is also losing weight despite eating and drinking a boost. Pt reports pee stream is really weak. Pt urinates about once  every 4 hours. Pt doesn't always feel like she needs to urinate. Bowel movement: pt reports concordant pain with normal bowel movement or pain. Pt has 1-2 bowel movements a day and are "pretty normal" but was having constipation and then diarrhea. 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 Pt has more indigestion too. 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 Pt has a competition coming up that she wants to compete in.    Pertinent History Anxiety, asthma both well controlled, hx of heavy periods    Patient Stated Goals I want to be able to work out again and decr. pain.    Currently in Pain? Yes    Pain Score 6     Pain Location Pelvis    Pain Orientation Lower    Pain Descriptors / Indicators Aching;Stabbing;Cramping    Pain Type Acute pain    Pain Onset 1 to 4 weeks ago    Pain Frequency Intermittent    Aggravating Factors  intercourse, incr. activity, OBGYN exam    Pain Relieving Factors medications, repositioning                Pelvic Floor Physical Therapy  Evaluation and Assessment  Screenings: Red Flags: yes Have you had any night sweats? no Unexplained weight loss? Pt has lost 10% of her body weight over the last month, without trying Saddle anesthesia? Pt reported she had N/T after a pain flare-up (over pelvis) Unexplained changes in bowel or bladder changes? She did have constipation and diarrhea and is scheduled to see a GI specialist.     OBJECTIVE Limited 2/2 time constraints Posture/Observations:  Sitting:  LEs crossed at knees Standing: incr. Cx spine lordosis    Range of Motion/Flexibilty:  Spine: Pt reported concordant back pain with ext, R and L sidebending with each producing pain in contralateral abodominal                Objective measurements completed on examination: See above findings.     Pelvic Floor Special Questions - 03/22/21 1551     Are you  Pregnant or attempting pregnancy? No    Prior Pregnancies No    Currently Sexually Active Yes    Is this Painful Yes    History of sexually transmitted disease No    Marinoff Scale pain interrupts completion    Urinary Leakage No    Fecal incontinence Yes    Fluid intake Pt drinks mostly water throughout the day. Occasional sweet tea    Caffeine beverages swee tea                       PT Education - 03/22/21 1632     Education Details PT educated pt on exam findings, POC, frequency, and duration. PT educated pt that PT will be able to assist in pelvic pain but to f/u with PCP and speialist re: GI symptoms, lightheadedness, and fainting. PT educated pt on the benefits of continuing meditation and to incorporate mindfulness into her day with breathing to reduce tension which can impact pelvic floor.    Person(s) Educated Patient;Parent(s)   mom: Jennifer Francis   Methods Explanation    Comprehension Verbalized understanding              PT Short Term Goals - 03/22/21 1644       PT SHORT TERM GOAL #1   Title see LTG section for all goals.               PT Long Term Goals - 03/22/21 1644       PT LONG TERM GOAL #1   Title Perform FOTO and write goal as indicated. TARGET DATE: 04/05/21    Time 2    Period Weeks    Status New    Target Date 04/05/21      PT LONG TERM GOAL #2   Title Pt will be IND in HEP to improve strength, balance, posture, and flexibility in order to decr. pain. TARGET DATE: 04/19/21    Time 4    Period Weeks    Status New    Target Date 04/19/21      PT LONG TERM GOAL #3   Title Pt will demonstrate proper aligment and incr. strength of surrounding pelvic musculature in order to decr. pain. to </=5/10 at worst.    Baseline 9/10 at worst    Time 4    Period Weeks    Status New    Target Date 04/19/21      PT LONG TERM GOAL #4   Title Pt will verbalize mindfulness technique to decr. pelvic floor tension and pain to improve QOL.  Time 8    Period Weeks    Status New    Target Date 05/17/21      PT LONG TERM GOAL #5   Title Pt will demonstrate proper coordination of pelvic floor and proper toileting posture in order to not experience pain during bowel movement.    Time 10    Period Weeks    Status New    Target Date 05/31/21                    Plan - 03/22/21 1638     Clinical Impression Statement Pt is a pleasant 20y/o presenting to OP pelvic health PT 2/2 pelvic pain. Pt's PMH is significant for the following: Anxiety, asthma both well controlled, hx of heavy periods. Pt's range of symptoms appear to be multi-factorial in nature and while PT will be beneficial for pelvic pain, I recommended she f/u with her PCP for a potential referral to a specialist re: fevers, fainting, SOB, weight loss, lightheadedness, etc. Today's exam was limited 2/2 time constraints but the following impairments were noted: pelvic pain, bowel incontinence, decr. flexibility, postural dysfunction, impaired balance. MMT not formally assessed but weakness likely 2/2 gait deviations and pt report. Pt would benefit from skilled PT to address impairments in order to reduce pain and improve functional mobility.    Personal Factors and Comorbidities Comorbidity 3+    Examination-Activity Limitations Carry;Continence;Toileting;Stand;Stairs;Squat;Sleep;Transfers;Sit;Locomotion Level    Examination-Participation Restrictions Occupation;Driving;Meal Prep;Interpersonal Relationship    Stability/Clinical Decision Making Evolving/Moderate complexity    Clinical Decision Making Moderate    Rehab Potential Good    PT Frequency 1x / week    PT Duration Other (comment)   10 weeks   PT Treatment/Interventions ADLs/Self Care Home Management;Gait training;Stair training;Functional mobility training;Therapeutic activities;Neuromuscular re-education;Manual techniques;Patient/family education;Therapeutic exercise;Balance training    PT Next Visit Plan  Complete spine/hip assessment, initiate HEP, perform orthostatics, MMT    Consulted and Agree with Plan of Care Patient             Patient will benefit from skilled therapeutic intervention in order to improve the following deficits and impairments:  Abnormal gait, Decreased balance, Decreased endurance, Impaired sensation, Decreased coordination, Decreased strength, Impaired flexibility, Postural dysfunction, Pain  Visit Diagnosis: Chronic low back pain, unspecified back pain laterality, unspecified whether sciatica present - Plan: PT plan of care cert/re-cert  Other abnormalities of gait and mobility - Plan: PT plan of care cert/re-cert  Other lack of coordination - Plan: PT plan of care cert/re-cert  Full incontinence of feces - Plan: PT plan of care cert/re-cert  Pain in left hip - Plan: PT plan of care cert/re-cert  Pain in right hip - Plan: PT plan of care cert/re-cert     Problem List Patient Active Problem List   Diagnosis Date Noted   Pelvic pain    Irregular menses 03/29/2018    Julies Carmickle L, PT 03/22/2021, 4:54 PM  [place] [facility] MAIN [facility] SERVICES [address] [place], [place], [address] Phone: [phone]   Fax:  [phone]  Name: Jennifer Francis MRN: [id number] Date of Birth: [birth year]  Zerita Boers, PT,DPT 03/22/21 4:56 PM Phone: [phone] Fax: [phone]

## 2021-03-24 ENCOUNTER — Ambulatory Visit: Payer: Self-pay

## 2021-03-25 ENCOUNTER — Ambulatory Visit: Payer: Federal, State, Local not specified - PPO

## 2021-03-29 ENCOUNTER — Ambulatory Visit (INDEPENDENT_AMBULATORY_CARE_PROVIDER_SITE_OTHER): Payer: Self-pay

## 2021-03-29 ENCOUNTER — Other Ambulatory Visit: Payer: Self-pay

## 2021-03-29 DIAGNOSIS — Z3042 Encounter for surveillance of injectable contraceptive: Secondary | ICD-10-CM

## 2021-03-29 MED ORDER — MEDROXYPROGESTERONE ACETATE 150 MG/ML IM SUSP
150.0000 mg | Freq: Once | INTRAMUSCULAR | Status: AC
  Administered 2021-03-29: 150 mg via INTRAMUSCULAR

## 2021-03-29 NOTE — Progress Notes (Signed)
Pt here for depo which was given IM RUOQ. NDC# 84210-312-81 Pt wriggled a little with needle insertion but tolerated well.

## 2021-03-31 ENCOUNTER — Other Ambulatory Visit: Payer: Self-pay

## 2021-03-31 ENCOUNTER — Ambulatory Visit: Payer: Medicaid Other

## 2021-03-31 DIAGNOSIS — G8929 Other chronic pain: Secondary | ICD-10-CM

## 2021-03-31 DIAGNOSIS — M545 Low back pain, unspecified: Secondary | ICD-10-CM | POA: Diagnosis not present

## 2021-03-31 DIAGNOSIS — R2689 Other abnormalities of gait and mobility: Secondary | ICD-10-CM

## 2021-03-31 DIAGNOSIS — M25551 Pain in right hip: Secondary | ICD-10-CM

## 2021-03-31 DIAGNOSIS — R278 Other lack of coordination: Secondary | ICD-10-CM

## 2021-03-31 DIAGNOSIS — M25552 Pain in left hip: Secondary | ICD-10-CM

## 2021-03-31 DIAGNOSIS — R159 Full incontinence of feces: Secondary | ICD-10-CM

## 2021-03-31 NOTE — Patient Instructions (Addendum)
Arm pumps X10 REPS IN SEATED POSITION TO INCR. BP PRIOR TO STANDING TO REDUCE DIZZINESS.  Access Code: [email or web address] URL: [email or web address] Date: 03/31/2021 Prepared by: Zerita Boers  Exercises Pelvic Floor Relaxation Supported Sitting in Chair - 1 x daily - 7 x weekly - 1 sets - 10 reps Diaphragmatic Breathing in Child's Pose with Pelvic Floor Relaxation - 1 x daily - 7 x weekly - 1 sets - 10 reps Supine Butterfly Groin Stretch - 1 x daily - 7 x weekly - 1 sets - 3 reps - 30-60 hold Supine Diaphragmatic Breathing - 1 x daily - 7 x weekly - 1 sets - 5 reps

## 2021-03-31 NOTE — Therapy (Signed)
[facility] MAIN [facility] SERVICES [address] [place], [place], [address] Phone: [phone]   Fax:  [phone]  Physical Therapy Treatment  Patient Details  Name: Jennifer Francis MRN: [id number] Date of Birth: [birth year] No data recorded  Encounter Date: 03/31/2021   PT End of Session - 03/31/21 1633     Visit Number 2    Number of Visits 10    Date for PT Re-Evaluation 05/31/21    Authorization Type Self pay    Progress Note Due on Visit 10    PT Start Time 1300    PT Stop Time 1400    PT Time Calculation (min) 60 min    Activity Tolerance Patient tolerated treatment well    Behavior During Therapy [facility] for tasks assessed/performed             Past Medical History:  Diagnosis Date   Anxiety    no meds   Asthma    no inhalers-well controlled   Heavy periods    Irregular periods    UTI (urinary tract infection)     Past Surgical History:  Procedure Laterality Date   LAPAROSCOPY N/A 02/25/2021   Procedure: LAPAROSCOPY DIAGNOSTIC;  Surgeon: Nadara Mustard, MD;  Location: [facility] ORS;  Service: Gynecology;  Laterality: N/A;   NO PAST SURGERIES      There were no vitals filed for this visit.   Subjective Assessment - 03/31/21 1303     Subjective Pt reported she's been really bad this last week and she's had to take Percocet every day. She has a lot of burning today in the lower abdomen and she is fainting sitting down now. She continues to report undigested stools. Pt reported her fingers have also been turning purple.    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 pain.    Currently in Pain? Yes    Pain Score --   7-8/10   Pain Location Abdomen    Pain Orientation Lower    Pain Descriptors / Indicators Burning;Sore    Pain Type Acute pain    Pain Radiating Towards  hips    Pain Onset 1 to 4 weeks ago    Pain Frequency Intermittent    Aggravating Factors  walking, standing    Pain Relieving Factors rest, percocet                     Vestibular Assessment - 03/31/21 1307       Orthostatics   BP supine (x 5 minutes) 98/64    HR supine (x 5 minutes) 71    BP sitting 102/61   had a heat flash and incr. in dizziness   HR sitting 65    BP standing (after 1 minute) 95/63   incr. in dizziness   HR standing (after 1 minute) 83    BP standing (after 3 minutes) 101/71   dizziness was the same   HR standing (after 3 minutes) 89            NMR:  Access Code: [email or web address] URL: [email or web address] Date: 03/31/2021 Prepared by: Zerita Boers  Exercises Pelvic Floor Relaxation Supported Sitting in Chair - 1 x daily - 7 x weekly - 1 sets - 10 reps Diaphragmatic Breathing in Child's Pose with Pelvic  Floor Relaxation - 1 x daily - 7 x weekly - 1 sets - 10 reps Supine Butterfly Groin Stretch - 1 x daily - 7 x weekly - 1 sets - 3 reps - 30-60 hold (one LE at a time with pillow under knee) Supine Diaphragmatic Breathing - 1 x daily - 7 x weekly - 1 sets - 5 reps  Performed with cues and demo for proper technique.       OBJECTIVE    Palpation/Segmental Motion/Joint Play: Tx spine hypomobility.   Special tests:   R and L ASLR: 3/5 difficulty, then 3-4/5 difficulty with TrA engaged.    Range of Motion/Flexibilty:  Hips: pt reported pain during B hip ER (in buttock/hip region) Pain at B SIJ but just had depo in L buttock earlier today.   Strength/MMT:  LE MMT  B knee ext: 4+/5 B knee flex: 4/5 with concordant abdominal/ovarian pain B ankle DF: 4/5 LE MMT Left Right  Hip flex:  (L2) 4/5 4/5  Hip ext: /5 /5  Hip abd: 3+/5 3+/5  Hip add: 4-/5 4-/5  Hip IR /5 /5  Hip ER /5 /5      Pelvic Floor External Exam: Introitus Appears:  Skin integrity:  Palpation: Cough: Prolapse visible?: Scar  mobility: Through clothing: yes Ischial tuberosities: TTP B Ischial tub. And surrounding tissue Palpation for pelvic floor contraction: bulge at first in sidelying and then able to palpate trace in hooklying Coccyx: TTP and in flexed position.             SELF CARE:  PT Education - 03/31/21 1631     Education Details PT educated pt on exam findings and initiated HEP to promote relaxation overall and pelvic floor. PT educated pt on orthostatic hypotension results. Although pt did not experience a significant decr. in BP, she was symptomatic and her resting BP is low to begin with. PT provided pt with instructions to perform arm pumps prior to standing to decr. dizziness/lightheadedness.    Person(s) Educated Patient;Parent(s)    Methods Explanation;Demonstration;Verbal cues;Handout    Comprehension Returned demonstration;Verbalized understanding;Need further instruction              PT Short Term Goals - 03/22/21 1644       PT SHORT TERM GOAL #1   Title see LTG section for all goals.               PT Long Term Goals - 03/22/21 1644       PT LONG TERM GOAL #1   Title Perform FOTO and write goal as indicated. TARGET DATE: 04/05/21    Time 2    Period Weeks    Status New    Target Date 04/05/21      PT LONG TERM GOAL #2   Title Pt will be IND in HEP to improve strength, balance, posture, and flexibility in order to decr. pain. TARGET DATE: 04/19/21    Time 4    Period Weeks    Status New    Target Date 04/19/21      PT LONG TERM GOAL #3   Title Pt will demonstrate proper aligment and incr. strength of surrounding pelvic musculature in order to decr. pain. to </=5/10 at worst.    Baseline 9/10 at worst    Time 4    Period Weeks    Status New    Target Date 04/19/21      PT LONG TERM GOAL #4   Title Pt will verbalize  mindfulness technique to decr. pelvic floor tension and pain to improve QOL.    Time 8    Period Weeks    Status New    Target Date  05/17/21      PT LONG TERM GOAL #5   Title Pt will demonstrate proper coordination of pelvic floor and proper toileting posture in order to not experience pain during bowel movement.    Time 10    Period Weeks    Status New    Target Date 05/31/21                   Plan - 03/31/21 1639     Clinical Impression Statement Pt was negative for orthostatic hypotension during testing today. However, she was symptomatic and BP did decr., therefore, PT provided pt with techniques to improve safety. Hypomobility of spine, especially Tx spine and coccyx noted today. Pt experienced pain during ASLR (B), indicating decr. ability to txf load from/to LEs to/from trunk. Pt also experienced incr. pain during palpation of diaphragm, SIJ, ITs and surrounding muscles. Pt had diffculty coordinating pelvic floor contraction in sidelying, with decr. bulging noted in hooklying. Overall, musculature was noted to have incr. tension, therefore, PT provided pt with exercises to down regulate central nervous system which can impact the pelvic floor. Pt would continue to benefit from skilled PT to improve all deficits listed above.    Personal Factors and Comorbidities Comorbidity 3+    Examination-Activity Limitations Carry;Continence;Toileting;Stand;Stairs;Squat;Sleep;Transfers;Sit;Locomotion Level    Examination-Participation Restrictions Occupation;Driving;Meal Prep;Interpersonal Relationship    Stability/Clinical Decision Making Evolving/Moderate complexity    Rehab Potential Good    PT Frequency 1x / week    PT Duration Other (comment)   10 weeks   PT Treatment/Interventions ADLs/Self Care Home Management;Gait training;Stair training;Functional mobility training;Therapeutic activities;Neuromuscular re-education;Manual techniques;Patient/family education;Therapeutic exercise;Balance training    PT Next Visit Plan FOTO. Add to HEP, hip stretches and strengthening, manual therapy: diaphragm, tx spine, hips     PT Home Exercise Plan [email or web address]    Consulted and Agree with Plan of Care Patient             Patient will benefit from skilled therapeutic intervention in order to improve the following deficits and impairments:  Abnormal gait, Decreased balance, Decreased endurance, Impaired sensation, Decreased coordination, Decreased strength, Impaired flexibility, Postural dysfunction, Pain  Visit Diagnosis: Chronic low back pain, unspecified back pain laterality, unspecified whether sciatica present  Pain in left hip  Pain in right hip  Other abnormalities of gait and mobility  Other lack of coordination  Full incontinence of feces     Problem List Patient Active Problem List   Diagnosis Date Noted   Pelvic pain    Irregular menses 03/29/2018    Babette Stum L, PT 03/31/2021, 4:45 PM  [facility] MAIN [facility] SERVICES [address] [place], [place], [address] Phone: [phone]   Fax:  [phone]  Name: LAILYN APPELBAUM MRN: [id number] Date of Birth: 03/27/2001   Zerita Boers, PT,DPT 03/31/21 4:45 PM Phone: [phone] Fax: [phone]

## 2021-04-05 ENCOUNTER — Ambulatory Visit: Payer: Medicaid Other

## 2021-04-05 ENCOUNTER — Other Ambulatory Visit: Payer: Self-pay

## 2021-04-05 DIAGNOSIS — R2689 Other abnormalities of gait and mobility: Secondary | ICD-10-CM

## 2021-04-05 DIAGNOSIS — M545 Low back pain, unspecified: Secondary | ICD-10-CM | POA: Diagnosis not present

## 2021-04-05 DIAGNOSIS — M25552 Pain in left hip: Secondary | ICD-10-CM

## 2021-04-05 DIAGNOSIS — G8929 Other chronic pain: Secondary | ICD-10-CM

## 2021-04-05 DIAGNOSIS — R159 Full incontinence of feces: Secondary | ICD-10-CM

## 2021-04-05 DIAGNOSIS — R278 Other lack of coordination: Secondary | ICD-10-CM

## 2021-04-05 DIAGNOSIS — M25551 Pain in right hip: Secondary | ICD-10-CM

## 2021-04-05 NOTE — Therapy (Signed)
[facility] MAIN [facility] SERVICES [address] [place], [place], [address] Phone: [phone]   Fax:  [phone]  Physical Therapy Treatment  Patient Details  Name: LINZEY RAMSER MRN: [id number] Date of Birth: 16-Mar-2001 No data recorded  Encounter Date: 04/05/2021   PT End of Session - 04/05/21 1550     Visit Number 3    Number of Visits 10    Date for PT Re-Evaluation 05/31/21    Authorization Type Self pay    Progress Note Due on Visit 10    PT Start Time 1437    PT Stop Time 1532    PT Time Calculation (min) 55 min    Activity Tolerance Patient tolerated treatment well    Behavior During Therapy [facility] for tasks assessed/performed             Past Medical History:  Diagnosis Date   Anxiety    no meds   Asthma    no inhalers-well controlled   Heavy periods    Irregular periods    UTI (urinary tract infection)     Past Surgical History:  Procedure Laterality Date   LAPAROSCOPY N/A 02/25/2021   Procedure: LAPAROSCOPY DIAGNOSTIC;  Surgeon: Nadara Mustard, MD;  Location: [facility] ORS;  Service: Gynecology;  Laterality: N/A;   NO PAST SURGERIES      There were no vitals filed for this visit.   Subjective Assessment - 04/05/21 1443     Subjective Pt reported her [facility] MD (PCP) prescribed gabapentin for pain and it makes her restless at night, so she has to take it more than 4 hours before bed. Pt has a bruise on her tailbone and a triangular lump above it. Pt has been performing HEP and finds it relaxing, especially before bedtime.    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 pain.    Currently in Pain? Yes    Pain Score 6     Pain Location Abdomen    Pain Orientation Lower    Pain Descriptors / Indicators Burning;Aching    Pain Type Acute pain    Pain Onset 1 to 4 weeks ago     Pain Frequency Constant    Aggravating Factors  walking, standing    Pain Relieving Factors rest, percocet                NMR: Access Code: [email or web address] URL: [email or web address] Date: 04/05/2021 Prepared by: Zerita Boers  Exercises Pelvic Floor Relaxation Supported Sitting in Chair - 1 x daily - 7 x weekly - 1 sets - 10 reps Diaphragmatic Breathing in Child's Pose with Pelvic Floor Relaxation - 1 x daily - 7 x weekly - 1 sets - 10 reps Supine Butterfly Groin Stretch - 1 x daily - 7 x weekly - 1 sets - 3 reps - 30-60 hold Supine Diaphragmatic Breathing - 1 x daily - 7 x weekly - 1 sets - 5 reps Reviewed HEP and provided cues as needed and performed with S for safety.               [facility] Adult PT Treatment/Exercise - 04/05/21 1449       Manual Therapy   Manual Therapy Joint mobilization;Soft tissue mobilization    Manual therapy comments Pt denied incr. in pain during manual therapy and  reported she was able to relax after B adductor manual therapy. PT provided STM to R diaphragm (inf.) with pt performed diaphragmatic breathing 2x5 reps to decr. adhesions. PT attempted to perform to L diaphragm but palpated lump around rib 10, with pt reported pain which caused pain in a ring around abdomen, starting at lump.    Joint Mobilization PT performed PA joint mobs grade 3 to sacrum and coccyx with pt in sidelying and seated positions with and without pt performing pelvic floor contraction x5 reps to improve ROM and decr. pain, as coccyx is in flexion and deviated to the R.    Soft tissue mobilization PT peformed STM and trigger point release to B hip adductors with pt in supine.                   SELF CARE:  PT Education - 04/05/21 1545     Education Details PT reiterated the importance of equal weight through LEs for proper posture in seated and standing. PT educated pt on not performing abdominal crunch from supine<>sit to decr. abdominal pressure,  and to perform sidelying<>sit instead. PT educated pt on the importance of informing PCP right away regarding lump found upon palpating diaphragm/rib (inf diaphragm, near rib 10). PT reiterated the importance of performing HEP to down regulate nervous system and relax pelvic floor. PT educated pt on internal PFM assessment next session, pt agreeable.    Person(s) Educated Parent(s);Patient    Methods Explanation;Verbal cues    Comprehension Verbalized understanding;Returned demonstration;Need further instruction              PT Short Term Goals - 03/22/21 1644       PT SHORT TERM GOAL #1   Title see LTG section for all goals.               PT Long Term Goals - 03/22/21 1644       PT LONG TERM GOAL #1   Title Perform FOTO and write goal as indicated. TARGET DATE: 04/05/21    Time 2    Period Weeks    Status New    Target Date 04/05/21      PT LONG TERM GOAL #2   Title Pt will be IND in HEP to improve strength, balance, posture, and flexibility in order to decr. pain. TARGET DATE: 04/19/21    Time 4    Period Weeks    Status New    Target Date 04/19/21      PT LONG TERM GOAL #3   Title Pt will demonstrate proper aligment and incr. strength of surrounding pelvic musculature in order to decr. pain. to </=5/10 at worst.    Baseline 9/10 at worst    Time 4    Period Weeks    Status New    Target Date 04/19/21      PT LONG TERM GOAL #4   Title Pt will verbalize mindfulness technique to decr. pelvic floor tension and pain to improve QOL.    Time 8    Period Weeks    Status New    Target Date 05/17/21      PT LONG TERM GOAL #5   Title Pt will demonstrate proper coordination of pelvic floor and proper toileting posture in order to not experience pain during bowel movement.    Time 10    Period Weeks    Status New    Target Date 05/31/21  Plan - 04/05/21 1551     Clinical Impression Statement Pt continues to experience incr. overall  muscle tension but was able to relax without cues at end of session after manual therapy. PT was able to palpate pelvic floor contraction without compensatory strategies this session. Pt's coccyx noted to be in flexed position and slightly deviated to the R side, which decr. ability of R side of pelvic floor to contract. PT will perform internal PFM assessment next session to grade strength and assess muscles and treat as indicated. PT did palpate lump along diaphragm on L side near 10th rib, PT advised pt to notify PCP right away to have it assessed. Pt continues to require breaks 2/2 lightheadedness and pain but required only 30-60 sec. prior to resuming activity, indicating progress. Pt would continue to benefit from skilled PT to adddress strength, pain, flexibility, and posture.    Personal Factors and Comorbidities Comorbidity 3+    Examination-Activity Limitations Carry;Continence;Toileting;Stand;Stairs;Squat;Sleep;Transfers;Sit;Locomotion Level    Examination-Participation Restrictions Occupation;Driving;Meal Prep;Interpersonal Relationship    Stability/Clinical Decision Making Evolving/Moderate complexity    Rehab Potential Good    PT Frequency 1x / week    PT Duration Other (comment)   10 weeks   PT Treatment/Interventions ADLs/Self Care Home Management;Gait training;Stair training;Functional mobility training;Therapeutic activities;Neuromuscular re-education;Manual techniques;Patient/family education;Therapeutic exercise;Balance training    PT Next Visit Plan FOTO. Add to HEP, hip stretches and strengthening, manual therapy: diaphragm, tx spine, hips. Ask if she reported lump to PCP.    PT Home Exercise Plan [email or web address]    Consulted and Agree with Plan of Care Patient             Patient will benefit from skilled therapeutic intervention in order to improve the following deficits and impairments:  Abnormal gait, Decreased balance, Decreased endurance, Impaired sensation, Decreased  coordination, Decreased strength, Impaired flexibility, Postural dysfunction, Pain  Visit Diagnosis: Pain in left hip  Pain in right hip  Other abnormalities of gait and mobility  Chronic low back pain, unspecified back pain laterality, unspecified whether sciatica present  Other lack of coordination  Full incontinence of feces     Problem List Patient Active Problem List   Diagnosis Date Noted   Pelvic pain    Irregular menses 03/29/2018    Cerenity Goshorn L, PT 04/05/2021, 3:56 PM   [facility] MAIN [facility] SERVICES [address] [place], [place], [address] Phone: [phone]   Fax:  [phone]  Name: DELAYNE SANZO MRN: [id number] Date of Birth: 11/23/2000   Zerita Boers, PT,DPT 04/05/21 3:57 PM Phone: [phone] Fax: [phone]

## 2021-04-13 ENCOUNTER — Other Ambulatory Visit: Payer: Self-pay

## 2021-04-13 ENCOUNTER — Ambulatory Visit: Payer: Medicaid Other | Attending: Obstetrics & Gynecology

## 2021-04-13 DIAGNOSIS — M545 Low back pain, unspecified: Secondary | ICD-10-CM

## 2021-04-13 DIAGNOSIS — G8929 Other chronic pain: Secondary | ICD-10-CM | POA: Insufficient documentation

## 2021-04-13 DIAGNOSIS — R278 Other lack of coordination: Secondary | ICD-10-CM | POA: Diagnosis present

## 2021-04-13 DIAGNOSIS — M25551 Pain in right hip: Secondary | ICD-10-CM | POA: Diagnosis present

## 2021-04-13 DIAGNOSIS — R159 Full incontinence of feces: Secondary | ICD-10-CM

## 2021-04-13 DIAGNOSIS — R2689 Other abnormalities of gait and mobility: Secondary | ICD-10-CM | POA: Diagnosis present

## 2021-04-13 DIAGNOSIS — M25552 Pain in left hip: Secondary | ICD-10-CM

## 2021-04-13 NOTE — Therapy (Signed)
[facility] MAIN [facility] SERVICES [address] [place], [place], [address] Phone: [phone]   Fax:  [phone]  Physical Therapy Treatment  Patient Details  Name: Jennifer Francis MRN: [id number] Date of Birth: [birth year] No data recorded  Encounter Date: 04/13/2021   PT End of Session - 04/13/21 1220     Visit Number 4    Number of Visits 10    Date for PT Re-Evaluation 05/31/21    Authorization Type Self pay    Progress Note Due on Visit 10    PT Start Time 1107    PT Stop Time 1205    PT Time Calculation (min) 58 min    Activity Tolerance Patient tolerated treatment well    Behavior During Therapy [facility] for tasks assessed/performed             Past Medical History:  Diagnosis Date   Anxiety    no meds   Asthma    no inhalers-well controlled   Heavy periods    Irregular periods    UTI (urinary tract infection)     Past Surgical History:  Procedure Laterality Date   LAPAROSCOPY N/A 02/25/2021   Procedure: LAPAROSCOPY DIAGNOSTIC;  Surgeon: Nadara Mustard, MD;  Location: [facility] ORS;  Service: Gynecology;  Laterality: N/A;   NO PAST SURGERIES      There were no vitals filed for this visit.   Subjective Assessment - 04/13/21 1112     Subjective Pt reported she went to a PCP and student MD stated she could palpate a lump inf. to L ribs but PCP stated it was actually pt's lower ribs. Pt states x-ray was negative. However, pt reported lump feels larger and she's having pain breathing. Pt reported she's been in bed for the last week 2/2 pain. She also states anxiety/depression type symptoms are incr. and she's going to talk to PCP about referral to a therapist.    Patient is accompained by: Family member   pt's mom   Pertinent History Anxiety, asthma both well controlled, hx of heavy periods    Patient Stated Goals I want to be able to work out again and decr. pain.    Currently in Pain? Yes    Pain Score --   8-9/10   Pain  Location Rib cage   L inf. ribs   Pain Orientation Left    Pain Descriptors / Indicators Sharp    Pain Type Acute pain    Pain Onset In the past 7 days    Pain Frequency Constant    Aggravating Factors  breathing in, L shoulder flex/abd.    Pain Relieving Factors rest, breathing out    Multiple Pain Sites Yes    Pain Score 6    Pain Location Pelvis    Pain Orientation Lower    Pain Descriptors / Indicators Aching;Burning    Pain Type Chronic pain    Pain Onset More than a month ago    Pain Frequency Constant    Aggravating Factors  walking, standing    Pain Relieving Factors gabapentin, rest                               [facility] Adult PT Treatment/Exercise - 04/13/21 1214       Manual Therapy   Manual Therapy Soft tissue mobilization;Internal Pelvic Floor    Manual therapy comments Pt reported no pain of levator ani after  internal trigger point release (during internal muscle assessment) but pt reported she still had diffuse pelvic pain at end of session.    Soft tissue mobilization PT performed STM and trigger point release to B diaphragm while pt performed diaphragmatic breathing. PT did not palpte lump medial to L 10th rib during this appt. However, pt reported pain medially to ribs today.    Internal Pelvic Floor PT performed pelvic floor muscle assessment, pt able to perform PFM contraction with squeeze and lift and hold for 2 sec. PT performed trigger point release to B levator ani with pt reporting decr. from 6/10 to 0/10 after release.                  SELF CARE:  PT Education - 04/13/21 1218     Education Details PT educated pt to inform MD if severe abdominal/rib pain continues. PT explained mass that was palpated last week was not palpable today. PT educated pt on location of 11th and 12th ribs and that it could be different from R and L side 2/2 spine curvature. PT also explained that mass palpated by PT last session was moveable and not  located on rib or cartilage. PT encouraged pt to continue mindfulness practices to reduce tension and stress, which can impact pelvic floor.    Person(s) Educated Patient;Parent(s)    Methods Explanation    Comprehension Verbalized understanding           Pt required breaks during diaphragm release 2/2 pain.   PT Short Term Goals - 03/22/21 1644       PT SHORT TERM GOAL #1   Title see LTG section for all goals.               PT Long Term Goals - 03/22/21 1644       PT LONG TERM GOAL #1   Title Perform FOTO and write goal as indicated. TARGET DATE: 04/05/21    Time 2    Period Weeks    Status New    Target Date 04/05/21      PT LONG TERM GOAL #2   Title Pt will be IND in HEP to improve strength, balance, posture, and flexibility in order to decr. pain. TARGET DATE: 04/19/21    Time 4    Period Weeks    Status New    Target Date 04/19/21      PT LONG TERM GOAL #3   Title Pt will demonstrate proper aligment and incr. strength of surrounding pelvic musculature in order to decr. pain. to </=5/10 at worst.    Baseline 9/10 at worst    Time 4    Period Weeks    Status New    Target Date 04/19/21      PT LONG TERM GOAL #4   Title Pt will verbalize mindfulness technique to decr. pelvic floor tension and pain to improve QOL.    Time 8    Period Weeks    Status New    Target Date 05/17/21      PT LONG TERM GOAL #5   Title Pt will demonstrate proper coordination of pelvic floor and proper toileting posture in order to not experience pain during bowel movement.    Time 10    Period Weeks    Status New    Target Date 05/31/21                   Plan - 04/13/21 1220  Clinical Impression Statement Today's skilled session focused on manual therapy to improve pain and decr. tension in diaphragm and pelvic floor musculature. PT was not able to palpate mass medially to inf. L ribs and educated pt to continue to monitor and notify MD if anything changes. Pt  demonstrated progress during session as tension and pain reduced after internal pelvic floor muscle trigger point release. Pt's LUQ pain did not decr. during session. Pt would continue to benefit from skilled PT to improve pain, strength, flexibility, posture and alignment in order to improve QOL during all funcitonal mobility and ADLS.    PT Treatment/Interventions ADLs/Self Care Home Management;Gait training;Stair training;Functional mobility training;Therapeutic activities;Neuromuscular re-education;Manual techniques;Patient/family education;Therapeutic exercise;Balance training    PT Next Visit Plan Check STGs. FOTO. Add to HEP, hip stretches and strengthening, manual therapy: diaphragm, tx spine, hips.             Patient will benefit from skilled therapeutic intervention in order to improve the following deficits and impairments:  Abnormal gait, Decreased balance, Decreased endurance, Impaired sensation, Decreased coordination, Decreased strength, Impaired flexibility, Postural dysfunction, Pain  Visit Diagnosis: Pain in left hip  Pain in right hip  Other abnormalities of gait and mobility  Chronic low back pain, unspecified back pain laterality, unspecified whether sciatica present  Other lack of coordination  Full incontinence of feces     Problem List Patient Active Problem List   Diagnosis Date Noted   Pelvic pain    Irregular menses 03/29/2018    Racheal Mathurin L, PT 04/13/2021, 12:24 PM   [facility] MAIN [facility] SERVICES [address] [place], [place], [address] Phone: [phone]   Fax:  [phone]  Name: Jennifer Francis MRN: [id number] Date of Birth: Oct 18, 2000   Zerita Boers, PT,DPT 04/13/21 12:25 PM Phone: [phone] Fax: [phone]

## 2021-04-15 ENCOUNTER — Encounter: Payer: Self-pay | Admitting: Gastroenterology

## 2021-04-15 ENCOUNTER — Other Ambulatory Visit: Payer: Self-pay

## 2021-04-15 ENCOUNTER — Ambulatory Visit (INDEPENDENT_AMBULATORY_CARE_PROVIDER_SITE_OTHER): Payer: Self-pay | Admitting: Gastroenterology

## 2021-04-15 VITALS — BP 113/67 | HR 92 | Temp 97.8°F | Ht 62.0 in | Wt 121.2 lb

## 2021-04-15 DIAGNOSIS — R197 Diarrhea, unspecified: Secondary | ICD-10-CM

## 2021-04-15 DIAGNOSIS — R634 Abnormal weight loss: Secondary | ICD-10-CM

## 2021-04-15 MED ORDER — PANTOPRAZOLE SODIUM 40 MG PO TBEC
40.0000 mg | DELAYED_RELEASE_TABLET | Freq: Every day | ORAL | 3 refills | Status: DC
Start: 2021-04-15 — End: 2021-11-13

## 2021-04-15 NOTE — Progress Notes (Signed)
Gastroenterology Consultation  Referring Provider:     Nadara Mustard, MD Primary Care Physician:  Care, [place] [facility] Primary Gastroenterologist:  Dr. Servando Snare     Reason for Consultation:     Abdominal pain with alternating diarrhea and constipation        HPI:   Jennifer Francis is a 20 y.o. y/o female referred for consultation & management of Abdominal pain with alternating diarrhea and constipation by Dr. Care, [place] [facility].  This patient was referred by OB/GYN for abdominal pain with alternating diarrhea and constipation.  The patient underwent a diagnostic laparoscopy in September of this year without any findings to explain her symptoms. The patient reports that she works at [employer] and on September 9 she had significant abdominal pain with diarrhea and inability to continue working therefore went home.  The patient has had pelvic pain since with a workup from OB/GYN without any findings found.  The patient comes with her mother and states that the diet she had been previously on did not contain unhealthy food and that she started to have abdominal discomfort that has not resolved.  She states that she has lost 15 pounds since then without trying.  She also reports that she has diarrhea.  The diarrhea is intermittent and not every day.  She did send off a stool study late last weren't sampled but states that on that day she wasn't having diarrhea but continues to have pain.  The stool sample did not show any signs of leukocytes or any abnormality.  The patient also reports that she wakes up every morning with nausea that usually lasts until 1:00.  She does not report any heartburn symptoms and she reports that she does not eat before she goes to bed.  There is no report of any black stools or bloody stools.  Despite reporting that she hadn't had any GI symptoms in the past it turns out that she had an attack of abdominal discomfort with similar symptoms 3 years ago but the abdominal pain was in a  different area.  At that time she states that she had been tested for celiac sprue and also had a small bowel follow-through.  The patient has been on a gluten-free diet from the time of the pain starting until the time she had the exploratory laparoscopy.  She did not notice a difference.  She also reports that she had been put on dicyclomine 10 mg 3 times a day without any help from that.  Past Medical History:  Diagnosis Date   Anxiety    no meds   Asthma    no inhalers-well controlled   Heavy periods    Irregular periods    UTI (urinary tract infection)     Past Surgical History:  Procedure Laterality Date   LAPAROSCOPY N/A 02/25/2021   Procedure: LAPAROSCOPY DIAGNOSTIC;  Surgeon: Nadara Mustard, MD;  Location: [facility] ORS;  Service: Gynecology;  Laterality: N/A;   NO PAST SURGERIES      Prior to Admission medications   Medication Sig Start Date End Date Taking? Authorizing Provider  acetaminophen (TYLENOL) 500 MG tablet Take 1,000 mg by mouth 2 (two) times daily. Up to 3,000 mg    [provider]  gabapentin (NEURONTIN) 100 MG capsule Take 100 mg by mouth 3 (three) times daily. Pt can take up to 300mg , tid    [provider]  medroxyPROGESTERone (DEPO-PROVERA) 150 MG/ML injection Inject 1 mL (150 mg total) into the muscle  every 3 (three) months. 03/04/21   Nadara Mustard, MD  naproxen (NAPROSYN) 500 MG tablet Take 500 mg by mouth 2 (two) times daily. 02/15/21   [provider]  ondansetron (ZOFRAN) 4 MG tablet Take 4 mg by mouth every 8 (eight) hours as needed for nausea or vomiting.    [provider]  oxyCODONE-acetaminophen (PERCOCET/ROXICET) 5-325 MG tablet Take by mouth every 4 (four) hours as needed for severe pain.    [provider]    Family History  Problem Relation Age of Onset   Breast cancer Maternal Grandmother        not sure of age   Diabetes Maternal Grandmother    Hypertension Maternal Grandfather    Ovarian cancer  Neg Hx    Colon cancer Neg Hx      Social History   Tobacco Use   Smoking status: Never   Smokeless tobacco: Never  Vaping Use   Vaping Use: Never used  Substance Use Topics   Alcohol use: Never   Drug use: Never    Allergies as of 04/15/2021 - Review Complete 04/13/2021  Allergen Reaction Noted   Sulfa antibiotics Anaphylaxis 10/30/2018    Review of Systems:    All systems reviewed and negative except where noted in HPI.   Physical Exam:  There were no vitals taken for this visit. No LMP recorded. Patient has had an injection. General:   Alert,  Well-developed, well-nourished, pleasant and cooperative in NAD Head:  Normocephalic and atraumatic. Eyes:  Sclera clear, no icterus.   Conjunctiva pink. Ears:  Normal auditory acuity. Neck:  Supple; no masses or thyromegaly. Lungs:  Respirations even and unlabored.  Clear throughout to auscultation.   No wheezes, crackles, or rhonchi. No acute distress. Heart:  Regular rate and rhythm; no murmurs, clicks, rubs, or gallops. Abdomen:  Normal bowel sounds.  No bruits.  Soft, non-tender and non-distended without masses, hepatosplenomegaly or hernias noted.  No guarding or rebound tenderness.  Negative Carnett sign.   Rectal:  Deferred.  Pulses:  Normal pulses noted. Extremities:  No clubbing or edema.  No cyanosis. Neurologic:  Alert and oriented x3;  grossly normal neurologically. Skin:  Intact without significant lesions or rashes.  No jaundice. Lymph Nodes:  No significant cervical adenopathy. Psych:  Alert and cooperative. Normal mood and affect.  Imaging Studies: No results found.  Assessment and Plan:   Jennifer Francis is a 20 y.o. y/o female Who comes in today with a history of GI symptoms that started on September 9 with weight loss intermittent diarrhea morning nausea lower abdominal pain and states that she also runs a temperature when she eats.  There is no family history of any inflammatory bowel disease and she states  that she has normal bowel movements in between her attacks of diarrhea 3 times a week on average.  The patient will be started on Protonix to be taken in the evening to see if this helps with her morning nausea.  The patient will also be set up for a small bowel follow-through with an upper GI series because of her report that food runs right through her within an hour of her eating and she states that she sees the same food she had just previously eaten.  The laparoscopic exam did not show any sign of inflammation suggestive inflammatory bowel disease needed to the CT scan and I am unsure that a colonoscopy or EGD will give [place] any insight into what is going on  at the present time.  The patient may have functional bowel disorder and is going to be notified of the results of her imaging Wednesday.  The patient and her mother have explained the plan and agree with it.    Midge Minium, MD. Clementeen Graham    Note: This dictation was prepared with Dragon dictation along with smaller phrase technology. Any transcriptional errors that result from this process are unintentional.

## 2021-04-19 ENCOUNTER — Ambulatory Visit: Payer: Medicaid Other

## 2021-04-26 ENCOUNTER — Other Ambulatory Visit: Payer: Self-pay

## 2021-04-26 ENCOUNTER — Ambulatory Visit
Admission: RE | Admit: 2021-04-26 | Discharge: 2021-04-26 | Disposition: A | Discharge status: Home / Self Care | Payer: Medicaid Other | Source: Ambulatory Visit | Attending: Gastroenterology | Admitting: Gastroenterology

## 2021-04-26 DIAGNOSIS — R197 Diarrhea, unspecified: Secondary | ICD-10-CM | POA: Insufficient documentation

## 2021-04-26 DIAGNOSIS — R634 Abnormal weight loss: Secondary | ICD-10-CM | POA: Insufficient documentation

## 2021-04-27 ENCOUNTER — Ambulatory Visit: Payer: Medicaid Other

## 2021-04-27 ENCOUNTER — Other Ambulatory Visit: Payer: Self-pay

## 2021-04-27 DIAGNOSIS — R2689 Other abnormalities of gait and mobility: Secondary | ICD-10-CM

## 2021-04-27 DIAGNOSIS — M25552 Pain in left hip: Secondary | ICD-10-CM | POA: Diagnosis not present

## 2021-04-27 DIAGNOSIS — R278 Other lack of coordination: Secondary | ICD-10-CM

## 2021-04-27 DIAGNOSIS — M545 Low back pain, unspecified: Secondary | ICD-10-CM

## 2021-04-27 DIAGNOSIS — R159 Full incontinence of feces: Secondary | ICD-10-CM

## 2021-04-27 DIAGNOSIS — M25551 Pain in right hip: Secondary | ICD-10-CM

## 2021-04-27 NOTE — Therapy (Signed)
[place] MAIN [facility] SERVICES [address] [place], [place], [address] Phone: [phone]   Fax:  [phone]  Physical Therapy Treatment  Patient Details  Name: Jennifer Francis MRN: [id number] Date of Birth: [birth year] No data recorded  Encounter Date: 04/27/2021   PT End of Session - 04/27/21 1216     Visit Number 5    Number of Visits 10    Date for PT Re-Evaluation 05/31/21    Authorization Type Self pay    Progress Note Due on Visit 10    PT Start Time 1102    PT Stop Time 1200    PT Time Calculation (min) 58 min    Activity Tolerance Patient limited by pain;Patient tolerated treatment well    Behavior During Therapy [facility] for tasks assessed/performed             Past Medical History:  Diagnosis Date   Anxiety    no meds   Asthma    no inhalers-well controlled   Heavy periods    Irregular periods    UTI (urinary tract infection)     Past Surgical History:  Procedure Laterality Date   LAPAROSCOPY N/A 02/25/2021   Procedure: LAPAROSCOPY DIAGNOSTIC;  Surgeon: Gae Dry, MD;  Location: [facility] ORS;  Service: Gynecology;  Laterality: N/A;   NO PAST SURGERIES      There were no vitals filed for this visit.   Subjective Assessment - 04/27/21 1106     Subjective Pt reported she's been feeling worse (pelvic pain) over the last few weeks, to the point she's had to take Percocet the last few nights. She ceased gabapentin as it was not helping and can't take Percocet while on gabapentin. Pt went to the GI appt and had an upper GI test, and results were normal. Pt has an appt with new PCP on 11/28. Pt stated HEP have been going great. Pt states heating pad helps but she has to have it very hot. L rib pain has improved but she does have intermittent L rib pain during inhalation.    Patient is accompained by: Family member   pt's mom   Pertinent History Anxiety, asthma both well controlled, hx of heavy periods    Patient Stated  Goals I want to be able to work out again and [employer]. pain.    Currently in Pain? Yes    Pain Score --   7-8/10   Pain Location Pelvis    Pain Orientation --   pelvis   Pain Descriptors / Indicators Burning;Sharp    Pain Type Chronic pain    Pain Radiating Towards knees (L worse) and back (low R worse)    Pain Onset More than a month ago    Pain Frequency Constant    Aggravating Factors  it's worse at night-unsure why    Pain Relieving Factors heat, medication            NMR: Access Code: [email or web address] URL: [email or web address] Date: 04/27/2021 Prepared by: Geoffry Paradise  Exercises Pelvic Floor Relaxation Supported Sitting in Chair - 1 x daily - 7 x weekly - 1 sets - 10 reps Diaphragmatic Breathing in Child's Pose with Pelvic Floor Relaxation - 1 x daily - 7 x weekly - 1 sets - 10 reps Supine Butterfly Groin Stretch - 1 x daily - 7 x weekly - 1 sets - 3 reps - 30-60 hold Supine Diaphragmatic Breathing - 1 x daily - 7 x weekly - 1 sets -  5 reps Supine Pelvic Floor Stretch - 1 x daily - 7 x weekly - 1 sets - 3 reps - 30-60 hold (happy baby with rocking side to side).  Pt performed with S for safety. Cues and demo for proper technique during new activities. Otherwise pt IND.                   [facility] Adult PT Treatment/Exercise - 04/27/21 1222       Manual Therapy   Manual Therapy Joint mobilization    Manual therapy comments Pt had a hard time differentiating between pelvic and groin/abdominal pain but was able to perform sustained stretch seated to promote posterior rotation of R pelvis.    Joint Mobilization PT assessed B pelvic and sacrum mobility with pt noted to be stuck in R ant. rotation. Pt unable to tolerate standing hip stretch, so performed in supine and was tolerated.                   Self Care:  PT Education - 04/27/21 1215     Education Details PT discussed goal progress and progressed HEP as tolerated. PT discussed L side of  pelvis in ant. rotation with decr. mobility, which can incr. pain. PT provided pt with activity to improve mobility and decr. pain.    Person(s) Educated Patient;Parent(s)    Methods Demonstration;Explanation;Tactile cues;Verbal cues;Handout    Comprehension Verbalized understanding;Returned demonstration;Need further instruction              PT Short Term Goals - 03/22/21 1644       PT SHORT TERM GOAL #1   Title see LTG section for all goals.               PT Long Term Goals - 04/27/21 1129       PT LONG TERM GOAL #1   Title Perform FOTO and write goal as indicated. TARGET DATE: 04/05/21    Time 2    Period Weeks    Status Achieved      PT LONG TERM GOAL #2   Title Pt will be IND in HEP to improve strength, balance, posture, and flexibility in order to decr. pain. TARGET DATE: 04/19/21    Time 4    Period Weeks    Status Achieved      PT LONG TERM GOAL #3   Title Pt will demonstrate proper aligment and incr. strength of surrounding pelvic musculature in order to decr. pain. to </=5/10 at worst.    Baseline 9/10 at worst; 11/22: 4-/5 B hip abd/add with improved pelvic alignment but tx spine still hypomobile and with kyphosis with pt reporting pelvic pain incr. to 9/10 at night.    Time 4    Period Weeks    Status Partially Met      PT LONG TERM GOAL #4   Title Pt will verbalize mindfulness technique to decr. pelvic floor tension and pain to improve QOL.    Time 8    Period Weeks    Status New      PT LONG TERM GOAL #5   Title Pt will demonstrate proper coordination of pelvic floor and proper toileting posture in order to not experience pain during bowel movement.    Time 10    Period Weeks    Status New      Additional Long Term Goals   Additional Long Term Goals Yes      PT LONG TERM GOAL #6  Title Pt will improve FOTO score to </= 75 on PFDI pain section to improve QOL.    Baseline 100    Time 10    Period Weeks    Status New    Target Date  05/31/21                   Plan - 04/27/21 1216     Clinical Impression Statement Pt demonstrated progress as she met STGs 1 and 2. Pt partially met STG 3, as hip strength improved but pt still noted to have R pelvic obliquity (ant. rotation) and intense pelvic pain. Pt required rest breaks during PT today 2/2 pelvic pain and feeling lightheaded during standing stretch. Pt also noted to continue to experience incr. rounding of shoulder, winging of B scapulae and Tx spine hypomobility. Pt's FOTO score indicated pt's pelvic floor pain is highly impacting QOL.  Posture and alignment can impact the pelvic floor musculature. Therefore, pt would continue to benefit from skilled PT to improve pain, posture, strength, and ROM.    PT Treatment/Interventions ADLs/Self Care Home Management;Gait training;Stair training;Functional mobility training;Therapeutic activities;Neuromuscular re-education;Manual techniques;Patient/family education;Therapeutic exercise;Balance training    PT Next Visit Plan Add to HEP, hip stretches and strengthening, manual therapy: diaphragm, tx spine, hips. Reassess R pelvic    PT Home Exercise Plan [email or web address]    Consulted and Agree with Plan of Care Patient;Family member/caregiver             Patient will benefit from skilled therapeutic intervention in order to improve the following deficits and impairments:  Abnormal gait, Decreased balance, Decreased endurance, Impaired sensation, Decreased coordination, Decreased strength, Impaired flexibility, Postural dysfunction, Pain  Visit Diagnosis: Pain in right hip  Other abnormalities of gait and mobility  Other lack of coordination  Full incontinence of feces  Chronic low back pain, unspecified back pain laterality, unspecified whether sciatica present     Problem List Patient Active Problem List   Diagnosis Date Noted   Pelvic pain    Irregular menses 03/29/2018    Charlaine Utsey L, PT 04/27/2021, 12:26  PM  [place] [address] [place], [place], [address] Phone: [phone]   Fax:  [phone]  Name: LACY SOFIA MRN: [id number] Date of Birth: 08-06-2000   Geoffry Paradise, PT,DPT 04/27/21 12:27 PM Phone: [phone] Fax: [phone]

## 2021-04-27 NOTE — Patient Instructions (Signed)
Access Code: [email or web address] URL: [email or web address] Date: 04/27/2021 Prepared by: Zerita Boers  Exercises Pelvic Floor Relaxation Supported Sitting in Chair - 1 x daily - 7 x weekly - 1 sets - 10 reps Diaphragmatic Breathing in Child's Pose with Pelvic Floor Relaxation - 1 x daily - 7 x weekly - 1 sets - 10 reps Supine Butterfly Groin Stretch - 1 x daily - 7 x weekly - 1 sets - 3 reps - 30-60 hold Supine Diaphragmatic Breathing - 1 x daily - 7 x weekly - 1 sets - 5 reps Supine Pelvic Floor Stretch - 1 x daily - 7 x weekly - 1 sets - 3 reps - 30-60 hold

## 2021-05-04 ENCOUNTER — Other Ambulatory Visit: Payer: Self-pay

## 2021-05-04 ENCOUNTER — Ambulatory Visit: Payer: Medicaid Other

## 2021-05-04 DIAGNOSIS — M25551 Pain in right hip: Secondary | ICD-10-CM

## 2021-05-04 DIAGNOSIS — M545 Low back pain, unspecified: Secondary | ICD-10-CM

## 2021-05-04 DIAGNOSIS — R2689 Other abnormalities of gait and mobility: Secondary | ICD-10-CM

## 2021-05-04 DIAGNOSIS — M25552 Pain in left hip: Secondary | ICD-10-CM | POA: Diagnosis not present

## 2021-05-04 DIAGNOSIS — R159 Full incontinence of feces: Secondary | ICD-10-CM

## 2021-05-04 DIAGNOSIS — G8929 Other chronic pain: Secondary | ICD-10-CM

## 2021-05-04 DIAGNOSIS — R278 Other lack of coordination: Secondary | ICD-10-CM

## 2021-05-04 NOTE — Therapy (Signed)
[place] MAIN [facility] SERVICES [address] [place], [place], [address] Phone: [phone]   Fax:  [phone]  Physical Therapy Treatment  Patient Details  Name: Jennifer Francis MRN: [id number] Date of Birth: 2000/11/05 No data recorded  Encounter Date: 05/04/2021   PT End of Session - 05/04/21 1143     Visit Number 6    Number of Visits 10    Date for PT Re-Evaluation 05/31/21    Authorization Type Self pay    Progress Note Due on Visit 10    PT Start Time 1103    PT Stop Time 1157    PT Time Calculation (min) 54 min    Activity Tolerance Patient tolerated treatment well    Behavior During Therapy [facility] for tasks assessed/performed             Past Medical History:  Diagnosis Date   Anxiety    no meds   Asthma    no inhalers-well controlled   Heavy periods    Irregular periods    UTI (urinary tract infection)     Past Surgical History:  Procedure Laterality Date   LAPAROSCOPY N/A 02/25/2021   Procedure: LAPAROSCOPY DIAGNOSTIC;  Surgeon: Gae Dry, MD;  Location: [facility] ORS;  Service: Gynecology;  Laterality: N/A;   NO PAST SURGERIES      There were no vitals filed for this visit.   Subjective Assessment - 05/04/21 1106     Subjective Pt reported she saw her new PCP yesterday, and was put on muscle relaxer to assist with pain. She is also going to test her for POTS, and referral has been sent to cardiologist. Pt is feeling a bit better today but was in severe pain for most of last week.    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 pain.    Currently in Pain? Yes    Pain Score 5     Pain Location Pelvis    Pain Descriptors / Indicators Burning;Sharp    Pain Type Chronic pain    Pain Onset More than a month ago    Pain Frequency Constant    Aggravating Factors  worse  at night    Pain Relieving Factors heat, medication    Pain Score --   3-4   Pain Location Rib cage    Pain Orientation Mid    Pain Descriptors / Indicators Burning    Pain Type Chronic pain    Pain Onset 1 to 4 weeks ago    Pain Frequency Intermittent    Aggravating Factors  deep inhalation    Pain Relieving Factors exhalation                               [facility] Adult PT Treatment/Exercise - 05/04/21 1138       Manual Therapy   Manual Therapy Joint mobilization;Soft tissue mobilization    Manual therapy comments Pt reported reduced pain with manual therapy from 6/10 to 1/10.    Joint Mobilization PT performed PA, grade 2-3 to L ribs (T6-T10) to improve mobility and reduce pain. PT performd SI joint, sacrum and pelvis mobility testing and concordant pain noted (and hypomobility) on B sides of sacrum, placed wash cloth parallel to R side  of sacrum and then L side to reduce pain for five minutes and improve mobility.    Soft tissue mobilization PT performed STM and trigger point release to B diaphragm while pt performed diaphragmatic breathing. PT performed STM to L Tx and Lx paraspinals and quadratus lumborum.           PT assessed pt's B ankles/feet while pt in supine with wash cloth placed parallel to R and L sacral spine. See assessment for details.           PT Education - 05/04/21 1143     Education Details PT discussed treatment for sacrum hypomobility.    Person(s) Educated Patient;Parent(s)    Methods Demonstration;Explanation;Tactile cues;Verbal cues    Comprehension Returned demonstration;Verbalized understanding              PT Short Term Goals - 03/22/21 1644       PT SHORT TERM GOAL #1   Title see LTG section for all goals.               PT Long Term Goals - 05/04/21 1213       PT LONG TERM GOAL #1   Title Perform FOTO and write goal as indicated. TARGET DATE: 04/05/21    Time 2    Period Weeks    Status Achieved       PT LONG TERM GOAL #2   Title Pt will be IND in HEP to improve strength, balance, posture, and flexibility in order to decr. pain. TARGET DATE: 04/19/21    Time 4    Period Weeks    Status Achieved      PT LONG TERM GOAL #3   Title Pt will demonstrate proper aligment and incr. strength of surrounding pelvic musculature in order to decr. pain. to </=5/10 at worst.    Baseline 9/10 at worst; 11/22: 4-/5 B hip abd/add with improved pelvic alignment but tx spine still hypomobile and with kyphosis with pt reporting pelvic pain incr. to 9/10 at night.    Time 4    Period Weeks    Status Partially Met    Target Date 04/19/21      PT LONG TERM GOAL #4   Title Pt will verbalize mindfulness technique to decr. pelvic floor tension and pain to improve QOL.    Time 8    Period Weeks    Status New    Target Date 05/17/21      PT LONG TERM GOAL #5   Title Pt will demonstrate proper coordination of pelvic floor and proper toileting posture in order to not experience pain during bowel movement.    Time 10    Period Weeks    Status New    Target Date 05/31/21      PT LONG TERM GOAL #6   Title Pt will improve FOTO score to </= 75 on PFDI pain section to improve QOL.    Baseline 100    Time 10    Period Weeks    Status New    Target Date 05/31/21                   Plan - 05/04/21 1208     Clinical Impression Statement Pt demonstrated progress as she reported less back pain/tension after manual therapy. Pt's B ilium retested today with good mobility noted. Pt's sacrum hypomobile (B) and treated with sustained PA pressure with pt in supine with washcloth parallel to sacral spine, 5 minutes on  each side. While in supine, PT assessed B ankle ROM, with decr. dorsiflexion noted and high arch, which can impact the pelvic floor musculature 2/2 ant. kinetic chain. Pt would continue to benefit from skilled PT to improve pain, continence, mobility and flexibility.    PT Treatment/Interventions  ADLs/Self Care Home Management;Gait training;Stair training;Functional mobility training;Therapeutic activities;Neuromuscular re-education;Manual techniques;Patient/family education;Therapeutic exercise;Balance training    PT Next Visit Plan Internal muscle assessment. Add to HEP prn, hip stretches and strengthening, manual therapy: diaphragm, tx spine, hips.    PT Home Exercise Plan [email or web address]    Consulted and Agree with Plan of Care Patient;Family member/caregiver             Patient will benefit from skilled therapeutic intervention in order to improve the following deficits and impairments:  Abnormal gait, Decreased balance, Decreased endurance, Impaired sensation, Decreased coordination, Decreased strength, Impaired flexibility, Postural dysfunction, Pain  Visit Diagnosis: Other abnormalities of gait and mobility  Other lack of coordination  Chronic low back pain, unspecified back pain laterality, unspecified whether sciatica present  Full incontinence of feces  Pain in right hip  Pain in left hip     Problem List Patient Active Problem List   Diagnosis Date Noted   Pelvic pain    Irregular menses 03/29/2018    Lonia Roane L, PT 05/04/2021, 12:14 PM  [place] [address] [place], [place], [address] Phone: [phone]   Fax:  [phone]  Name: Jennifer Francis MRN: [id number] Date of Birth: [birth year]   Geoffry Paradise, PT,DPT 05/04/21 12:14 PM Phone: [phone] Fax: [phone]

## 2021-05-06 ENCOUNTER — Telehealth: Payer: Self-pay

## 2021-05-06 NOTE — Telephone Encounter (Signed)
-----   Message from Midge Minium, MD sent at 04/27/2021  8:14 AM EST ----- Please let the patient know that the small bowel follow-through was normal.

## 2021-05-06 NOTE — Telephone Encounter (Signed)
Pt notified of small bowel follow up through results through Mychart.

## 2021-05-24 ENCOUNTER — Ambulatory Visit: Payer: 59

## 2021-06-09 ENCOUNTER — Ambulatory Visit: Payer: 59 | Attending: Obstetrics & Gynecology

## 2021-06-09 DIAGNOSIS — G8929 Other chronic pain: Secondary | ICD-10-CM | POA: Insufficient documentation

## 2021-06-09 DIAGNOSIS — M25551 Pain in right hip: Secondary | ICD-10-CM | POA: Insufficient documentation

## 2021-06-09 DIAGNOSIS — M25552 Pain in left hip: Secondary | ICD-10-CM | POA: Insufficient documentation

## 2021-06-09 DIAGNOSIS — R278 Other lack of coordination: Secondary | ICD-10-CM | POA: Insufficient documentation

## 2021-06-09 DIAGNOSIS — R159 Full incontinence of feces: Secondary | ICD-10-CM | POA: Insufficient documentation

## 2021-06-09 DIAGNOSIS — M545 Low back pain, unspecified: Secondary | ICD-10-CM | POA: Insufficient documentation

## 2021-06-09 DIAGNOSIS — R2689 Other abnormalities of gait and mobility: Secondary | ICD-10-CM | POA: Insufficient documentation

## 2021-06-14 ENCOUNTER — Other Ambulatory Visit: Payer: Self-pay

## 2021-06-14 ENCOUNTER — Ambulatory Visit: Payer: 59

## 2021-06-14 DIAGNOSIS — G8929 Other chronic pain: Secondary | ICD-10-CM | POA: Diagnosis present

## 2021-06-14 DIAGNOSIS — R159 Full incontinence of feces: Secondary | ICD-10-CM | POA: Diagnosis present

## 2021-06-14 DIAGNOSIS — R278 Other lack of coordination: Secondary | ICD-10-CM

## 2021-06-14 DIAGNOSIS — M545 Low back pain, unspecified: Secondary | ICD-10-CM | POA: Diagnosis present

## 2021-06-14 DIAGNOSIS — M25552 Pain in left hip: Secondary | ICD-10-CM

## 2021-06-14 DIAGNOSIS — M25551 Pain in right hip: Secondary | ICD-10-CM | POA: Diagnosis present

## 2021-06-14 DIAGNOSIS — R2689 Other abnormalities of gait and mobility: Secondary | ICD-10-CM

## 2021-06-14 NOTE — Therapy (Signed)
[place] MAIN [facility] SERVICES [address] [place], [place], [address] Phone: [phone]   Fax:  [phone]  Physical Therapy Treatment  Patient Details  Name: Jennifer Francis MRN: [id number] Date of Birth: [birth year] No data recorded  Encounter Date: 06/14/2021   PT End of Session - 06/14/21 1252     Visit Number 7    Number of Visits 10    Date for PT Re-Evaluation 05/31/21   with new POC adding four more visits: total 11 with new LTG: 07/12/21   Authorization Type Self pay    Progress Note Due on Visit 10    PT Start Time 1003    PT Stop Time 1058    PT Time Calculation (min) 55 min    Activity Tolerance Patient tolerated treatment well    Behavior During Therapy [facility] for tasks assessed/performed             Past Medical History:  Diagnosis Date   Anxiety    no meds   Asthma    no inhalers-well controlled   Heavy periods    Irregular periods    UTI (urinary tract infection)     Past Surgical History:  Procedure Laterality Date   LAPAROSCOPY N/A 02/25/2021   Procedure: LAPAROSCOPY DIAGNOSTIC;  Surgeon: Gae Dry, MD;  Location: [facility] ORS;  Service: Gynecology;  Laterality: N/A;   NO PAST SURGERIES      There were no vitals filed for this visit.   Subjective Assessment - 06/14/21 1008     Subjective Pt is being tested for POTS on 06/16/21. She's been having some anxiety 2/2 a breakup. MD told pt to try Advil with tylenol vs. Oxycodone and that's been helping. She also started hydrozyzine 25mg . Pt has been feeling about the same. Gas, indigestion has been painful. Pelvic pain is about the same and fatigues quickly. Pt has been performing some HEP (more stretching, adductor, SIJ stretch and breathing). Pt has new PCP, Junie Panning, MD.  Pt reported she had a UTI on 05/03/21.    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 pain.    Currently in Pain? Yes    Pain Score 7     Pain Location Pelvis    Pain Orientation Lower    Pain Descriptors / Indicators Aching;Burning    Pain Type Chronic pain    Pain Onset More than a month ago    Pain Frequency Constant    Aggravating Factors  exercise, walking, weight bearing, LE movement    Pain Relieving Factors heat, medication                [facility] PT Assessment - 06/14/21 1249       Palpation   SI assessment  Decr. posterior pelvic rotation and R upper quadrant of sarcum in posterior fixation with pt reporting pain.                 Manual therapy: SI joint:  Place washcloth over RIGHT side of sacrum (parallel to butt) for 5 minutes each day for one week. Then perform twice a week the following week.   Also performed seated stretch on RIGHT side for pelvis, hold for two minutes daily for one week, and twice a week the following week.   Access Code:  [email or web address] URL: [email or web address] Date: 06/14/2021 Prepared by: Geoffry Paradise  NMR:  Exercises Pelvic Floor Relaxation Supported Sitting in Chair - 1 x daily - 7 x weekly - 1 sets - 10 reps Diaphragmatic Breathing in Child's Pose with Pelvic Floor Relaxation - 1 x daily - 7 x weekly - 1 sets - 10 reps Supine Butterfly Groin Stretch - 1 x daily - 7 x weekly - 1 sets - 3 reps - 30-60 hold Supine Diaphragmatic Breathing - 1 x daily - 7 x weekly - 1 sets - 5 reps Supine Pelvic Floor Stretch - 1 x daily - 7 x weekly - 1 sets - 3 reps - 30-60 hold Supine Angels - 1 x daily - 7 x weekly - 1 sets - 10 reps Sidelying Open Book - 1 x daily - 7 x weekly - 1 sets - 10 reps  Cues and demo for proper technique. Performed with S for safety.                PT Education - 06/14/21 1251     Education Details PT discussed treatment and HEP for sacrum and pelvis hypomobility. PT added open book and angels to HEP to improve tx spine  mobility, which can impact pelvic floor. PT discussed renewal/extension as pt missed five weeks of PT 2/2 scheduling/illness. PT reviewed goals with pt.    Person(s) Educated Patient;Parent(s)    Methods Explanation;Demonstration;Verbal cues;Handout    Comprehension Verbalized understanding;Returned demonstration;Need further instruction              PT Short Term Goals - 03/22/21 1644       PT SHORT TERM GOAL #1   Title see LTG section for all goals.               PT Long Term Goals - 06/14/21 1253       PT LONG TERM GOAL #1   Title Perform FOTO and write goal as indicated. TARGET DATE: 04/05/21    Time 2    Period Weeks    Status Achieved      PT LONG TERM GOAL #2   Title Pt will be IND in HEP to improve strength, balance, posture, and flexibility in order to decr. pain. TARGET DATE: 04/19/21    Time 4    Period Weeks    Status Achieved      PT LONG TERM GOAL #3   Title Pt will demonstrate proper aligment and incr. strength of surrounding pelvic musculature in order to decr. pain. to </=5/10 at worst.    Baseline 9/10 at worst; 11/22: 4-/5 B hip abd/add with improved pelvic alignment but tx spine still hypomobile and with kyphosis with pt reporting pelvic pain incr. to 9/10 at night.    Time 4    Period Weeks    Status On-going    Target Date 07/12/21      PT LONG TERM GOAL #4   Title Pt will verbalize mindfulness technique to decr. pelvic floor tension and pain to improve QOL.    Time 4    Period Weeks    Status On-going    Target Date 05/17/21      PT LONG TERM GOAL #5   Title Pt will demonstrate proper coordination of pelvic floor and proper toileting posture in order to not experience pain during bowel movement.    Time 4    Period Weeks    Status On-going    Target Date 07/12/21  PT LONG TERM GOAL #6   Title Pt will improve FOTO score to </= 75 on PFDI pain section to improve QOL.    Baseline 100    Time 4    Period Weeks    Status On-going     Target Date 07/12/21                   Plan - 06/14/21 1255     Clinical Impression Statement Pt missed last month of PT 2/2 scheduling and illness (pt and PT). Therefore, today's skilled session focused on assessment and goal. Pt continues to experience hypomobility of tx spine and sacrum. Pt required cues during former HEP and new HEP to ensure proper technique and demonstrated progress in session after diaphragmatic breathing and stretches as she reported she felt more relaxed. Pt continues to benefit from inter-regional approach to improve flexibility, strength, ROM, mobility in order to decr. pain. PT requesting additional 1x/week for 4 weeks. All LTGs will be carried over to new POC. Pt would continue to benefit from skilled PT to improve deficits listed above.    Personal Factors and Comorbidities Comorbidity 3+    Examination-Activity Limitations Carry;Continence;Toileting;Stand;Stairs;Squat;Sleep;Transfers;Sit;Locomotion Level    Examination-Participation Restrictions Occupation;Driving;Meal Prep;Interpersonal Relationship    PT Frequency 1x / week    PT Duration 4 weeks   asking for add'l 1x/week for 4 weeks.   PT Treatment/Interventions ADLs/Self Care Home Management;Gait training;Stair training;Functional mobility training;Therapeutic activities;Neuromuscular re-education;Manual techniques;Patient/family education;Therapeutic exercise;Balance training    PT Next Visit Plan Internal muscle assessment. Add to HEP prn, hip stretches and strengthening, manual therapy: diaphragm, tx spine, hips.    PT Home Exercise Plan [email or web address]    Consulted and Agree with Plan of Care Patient;Family member/caregiver             Patient will benefit from skilled therapeutic intervention in order to improve the following deficits and impairments:  Abnormal gait, Decreased balance, Decreased endurance, Impaired sensation, Decreased coordination, Decreased strength, Impaired flexibility,  Postural dysfunction, Pain  Visit Diagnosis: Other abnormalities of gait and mobility  Other lack of coordination  Chronic low back pain, unspecified back pain laterality, unspecified whether sciatica present  Full incontinence of feces  Pain in left hip  Pain in right hip     Problem List Patient Active Problem List   Diagnosis Date Noted   Pelvic pain    Irregular menses 03/29/2018    Tekelia Kareem L, PT 06/14/2021, 1:05 PM  [place], [place], [address] Phone: [phone]   Fax:  [phone]  Name: JULLIE DESHOTELS MRN: [id number] Date of Birth: 2001-04-19  NEW PCP:  Laureen Abrahams, DO (Dec. 05, 2022December 05, 2022 - Present) [phone] (Work) [phone] (Fax) [phone] [place], [place] [address] Family Medicine [facility] [address] [place], [place] [address]  Dec. 16, 2022December 16, 2022 - Dec. 20, 2022December 20, 2022    Geoffry Paradise, PT,DPT 06/14/21 1:06 PM Phone: [phone] Fax: [phone]

## 2021-06-14 NOTE — Patient Instructions (Addendum)
SI joint:  Place washcloth over RIGHT side of sacrum (parallel to butt) for 5 minutes each day for one week. Then perform twice a week the following week.   Also performed seated stretch on RIGHT side for pelvis, hold for two minutes daily for one week, and twice a week the following week.   Access Code: [email or web address] URL: [email or web address] Date: 06/14/2021 Prepared by: Zerita Boers  Exercises Pelvic Floor Relaxation Supported Sitting in Chair - 1 x daily - 7 x weekly - 1 sets - 10 reps Diaphragmatic Breathing in Child's Pose with Pelvic Floor Relaxation - 1 x daily - 7 x weekly - 1 sets - 10 reps Supine Butterfly Groin Stretch - 1 x daily - 7 x weekly - 1 sets - 3 reps - 30-60 hold Supine Diaphragmatic Breathing - 1 x daily - 7 x weekly - 1 sets - 5 reps Supine Pelvic Floor Stretch - 1 x daily - 7 x weekly - 1 sets - 3 reps - 30-60 hold Supine Angels - 1 x daily - 7 x weekly - 1 sets - 10 reps Sidelying Open Book - 1 x daily - 7 x weekly - 1 sets - 10 reps

## 2021-06-14 NOTE — Addendum Note (Signed)
Addended by: Sherren Kerns on: 06/14/2021 01:33 PM   Modules accepted: Orders

## 2021-06-21 ENCOUNTER — Ambulatory Visit (INDEPENDENT_AMBULATORY_CARE_PROVIDER_SITE_OTHER): Payer: 59

## 2021-06-21 ENCOUNTER — Ambulatory Visit: Payer: 59

## 2021-06-21 ENCOUNTER — Ambulatory Visit: Payer: Self-pay

## 2021-06-21 ENCOUNTER — Other Ambulatory Visit: Payer: Self-pay

## 2021-06-21 DIAGNOSIS — R159 Full incontinence of feces: Secondary | ICD-10-CM

## 2021-06-21 DIAGNOSIS — M25552 Pain in left hip: Secondary | ICD-10-CM

## 2021-06-21 DIAGNOSIS — R278 Other lack of coordination: Secondary | ICD-10-CM

## 2021-06-21 DIAGNOSIS — Z3042 Encounter for surveillance of injectable contraceptive: Secondary | ICD-10-CM

## 2021-06-21 DIAGNOSIS — M25551 Pain in right hip: Secondary | ICD-10-CM

## 2021-06-21 DIAGNOSIS — G8929 Other chronic pain: Secondary | ICD-10-CM

## 2021-06-21 DIAGNOSIS — R2689 Other abnormalities of gait and mobility: Secondary | ICD-10-CM

## 2021-06-21 MED ORDER — MEDROXYPROGESTERONE ACETATE 150 MG/ML IM SUSP
150.0000 mg | Freq: Once | INTRAMUSCULAR | Status: AC
  Administered 2021-06-21: 150 mg via INTRAMUSCULAR

## 2021-06-21 NOTE — Therapy (Signed)
[facility] [address] [place], [place], [address] Phone: [phone]   Fax:  [phone]  Physical Therapy Treatment  Patient Details  Name: Jennifer Francis MRN: [id number] Date of Birth: 01-23-2001 No data recorded  Encounter Date: 06/21/2021   PT End of Session - 06/21/21 1609     Visit Number 8    Number of Visits 11    Date for PT Re-Evaluation 07/12/21    Authorization Type Self pay    Progress Note Due on Visit 10    PT Start Time 1504    PT Stop Time 1600    PT Time Calculation (min) 56 min    Activity Tolerance Patient tolerated treatment well    Behavior During Therapy [facility] for tasks assessed/performed             Past Medical History:  Diagnosis Date   Anxiety    no meds   Asthma    no inhalers-well controlled   Heavy periods    Irregular periods    UTI (urinary tract infection)     Past Surgical History:  Procedure Laterality Date   LAPAROSCOPY N/A 02/25/2021   Procedure: LAPAROSCOPY DIAGNOSTIC;  Surgeon: Nadara MustardHarris, Robert P, MD;  Location: [facility] ORS;  Service: Gynecology;  Laterality: N/A;   NO PAST SURGERIES      There were no vitals filed for this visit.   Subjective Assessment - 06/21/21 1507     Subjective Pt reported she saw cardiologist last week and is currenlty undergoing testing (heart monitor for a week) for several diagnoses, next Monday tilt table and lab work. Jennifer PCP prescribed xanax as needed for panick attacks. Pt received depo shot today in R glute. Pt has been able to perform HEP. Pt reported she hasn't started wearing compression stockings or abdominal binders (Spanx) yet or incr. sodium intake. She is ordering all of this. Pt was wondering Omega 7 sea buckthorn oil for skin dryness. Pt also wondering if depo could be causing pain.    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 pain.    Currently in Pain? No/denies   pt took medication prior to appt.                              [facility]/Exercise - 06/21/21 1544       Manual Therapy   Manual Therapy Soft tissue mobilization;Internal Pelvic Floor    Manual therapy comments PT reported slight burning sensation after manual therapy but reported decr. tension during internal muscle treatment. PT reported decr. back tension after manual therapy. PT encouraged pt to amb. 3x10' after manual with reciprocal arm swing to decr. trunk tension.    Joint Mobilization Tx and Lx spine mobility assessed and no pain reported with incr. T4-T7 hypomobililty but imporved overall.    Soft tissue mobilization PT performed STM and trigger point release to R quadratus lumborum. PT assessed diaphragm with no restrictions noted.    Internal Pelvic Floor PT performed pelvic floor muscle assessment. PT performed trigger point release to B levator ani and obterator internus with pt reporting 5/10 at introitus on R side with 0/10 during at introitus on Francis side. Francis OI pain incr. vs.  Francis side. with notable muscle release and decr. tension with manual therapy.                   SELF CARE:  PT Education - 06/21/21 1608     Education Details PT discussed findings during internal muscle assessement. PT encouraged pt to perform stretches/breathing prior to intercourse or exam to decr. pelvic pain. PT educated pt to notify MD if she starts any new supplements.    Person(s) Educated Patient    Methods Explanation    Comprehension Verbalized understanding              PT Short Term Goals - 03/22/21 1644       PT SHORT TERM GOAL #1   Title see LTG section for all goals.               PT Long Term Goals - 06/21/21 1612       PT LONG TERM GOAL #1   Title Perform FOTO and write goal as indicated. TARGET DATE: 04/05/21    Time 2     Period Weeks    Status Achieved      PT LONG TERM GOAL #2   Title Pt will be IND in HEP to improve strength, balance, posture, and flexibility in order to decr. pain. TARGET DATE: 04/19/21    Time 4    Period Weeks    Status Achieved      PT LONG TERM GOAL #3   Title Pt will demonstrate proper aligment and incr. strength of surrounding pelvic musculature in order to decr. pain. to </=5/10 at worst.    Baseline 9/10 at worst; 11/22: 4-/5 B hip abd/add with improved pelvic alignment but tx spine still hypomobile and with kyphosis with pt reporting pelvic pain incr. to 9/10 at night.    Time 4    Period Weeks    Status On-going    Target Date 07/12/21      PT LONG TERM GOAL #4   Title Pt will verbalize mindfulness technique to decr. pelvic floor tension and pain to improve QOL.    Time 4    Period Weeks    Status On-going    Target Date 07/12/21      PT LONG TERM GOAL #5   Title Pt will demonstrate proper coordination of pelvic floor and proper toileting posture in order to not experience pain during bowel movement.    Time 4    Period Weeks    Status On-going    Target Date 07/12/21      PT LONG TERM GOAL #6   Title Pt will improve FOTO score to </= 75 on PFDI pain section to improve QOL.    Baseline 100    Time 4    Period Weeks    Status On-going    Target Date 07/12/21                   Plan - 06/21/21 1610     Clinical Impression Statement Pt demonstrated progress as she was able to perform diaphragmatic breathing during pelvic floor manual therapy with decr. muscle tension noted. Jennifer spine mobility improved overall with hypomobility noted of T4--T7. Pt continues to experience incr. pelvic pain, postural dysfunction and incr. muscle tension and would continue to benefit from skilled PT to improve deficits.    PT Treatment/Interventions ADLs/Self Care Home Management;Gait training;Stair training;Functional mobility training;Therapeutic activities;Neuromuscular  re-education;Manual techniques;Patient/family education;Therapeutic exercise;Balance training    PT  Next Visit Plan Internal muscle assessment. Add to HEP prn, hip stretches and strengthening, manual therapy: diaphragm, tx spine, hips.    PT Home Exercise Plan [email or web address]    Consulted and Agree with Plan of Care Patient;Family member/caregiver             Patient will benefit from skilled therapeutic intervention in order to improve the following deficits and impairments:  Abnormal gait, Decreased balance, Decreased endurance, Impaired sensation, Decreased coordination, Decreased strength, Impaired flexibility, Postural dysfunction, Pain  Visit Diagnosis: Other lack of coordination  Other abnormalities of gait and mobility  Pain in left hip  Pain in right hip  Full incontinence of feces  Chronic low back pain, unspecified back pain laterality, unspecified whether sciatica present     Problem List Patient Active Problem List   Diagnosis Date Noted   Pelvic pain    Irregular menses 03/29/2018    Jennifer Francis, PT 06/21/2021, 4:13 PM  [facility] MAIN [name] SERVICES [address] [place], [place], [address] Phone: [phone]   Fax:  [phone]  Name: Jennifer Francis MRN: [id number] Date of Birth: 08-22-2000   Zerita Boers, PT,DPT 06/21/21 4:13 PM Phone: [phone] Fax: [phone]

## 2021-06-21 NOTE — Progress Notes (Signed)
Pt here for depo which was given IM right glut.  Pt wiggled and said she didn't know why.  NDC# [phone]

## 2021-06-28 ENCOUNTER — Ambulatory Visit: Payer: 59

## 2021-06-28 ENCOUNTER — Other Ambulatory Visit: Payer: Self-pay

## 2021-06-28 VITALS — BP 116/68 | HR 86

## 2021-06-28 DIAGNOSIS — R278 Other lack of coordination: Secondary | ICD-10-CM | POA: Diagnosis not present

## 2021-06-28 DIAGNOSIS — M25551 Pain in right hip: Secondary | ICD-10-CM

## 2021-06-28 DIAGNOSIS — R159 Full incontinence of feces: Secondary | ICD-10-CM

## 2021-06-28 DIAGNOSIS — M545 Low back pain, unspecified: Secondary | ICD-10-CM

## 2021-06-28 DIAGNOSIS — G8929 Other chronic pain: Secondary | ICD-10-CM

## 2021-06-28 DIAGNOSIS — M25552 Pain in left hip: Secondary | ICD-10-CM

## 2021-06-28 DIAGNOSIS — R2689 Other abnormalities of gait and mobility: Secondary | ICD-10-CM

## 2021-06-28 NOTE — Therapy (Addendum)
[place] MAIN [facility] SERVICES [address] [place], [place], [address] Phone: [phone]   Fax:  [phone]  Physical Therapy Treatment  Patient Details  Name: Jennifer Francis MRN: [id number] Date of Birth: [birth year] No data recorded  Encounter Date: 06/28/2021   PT End of Session - 06/28/21 1420     Visit Number 9    Number of Visits 11    Date for PT Re-Evaluation 07/12/21    Authorization Type Self pay    Progress Note Due on Visit 10    PT Start Time 1300    PT Stop Time 1411    PT Time Calculation (min) 71 min    Activity Tolerance Patient tolerated treatment well;No increased pain    Behavior During Therapy WFL for tasks assessed/performed             Past Medical History:  Diagnosis Date   Anxiety    no meds   Asthma    no inhalers-well controlled   Heavy periods    Irregular periods    UTI (urinary tract infection)     Past Surgical History:  Procedure Laterality Date   LAPAROSCOPY N/A 02/25/2021   Procedure: LAPAROSCOPY DIAGNOSTIC;  Surgeon: Gae Dry, MD;  Location: [facility] ORS;  Service: Gynecology;  Laterality: N/A;   NO PAST SURGERIES      Vitals:   06/28/21 1303 06/28/21 1342 06/28/21 1404  BP: 98/67 113/60 116/68  Pulse: 71 85 86     Subjective Assessment - 06/28/21 1303     Subjective Pt has echo and tilt table test after PT appt today. Pt reported no new changes. Pt did start electrolyte drink last week but hasn't felt any changes. Pt still waiting on purchasing compression stockings or spanx 2/2 finanaces. Pt tried an at home work out with bands and pt felt like heart was about to explode.    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 pain.    Currently in Pain? Yes    Pain Score --   3-4/10   Pain Location Pelvis    Pain Orientation Lower     Pain Descriptors / Indicators Aching;Burning    Pain Type Chronic pain    Pain Frequency Constant    Aggravating Factors  exercise, walking, running, weight bearing, LE movement    Pain Relieving Factors heat, medication                   NMR: Pt performed peloton app barre workouts with and without video and with PT providing demo and cues for proper technique. Performed with chair in standing for support and S for safety. Rest breaks taken throughout session to monitor BP and decr. BLE shaking 2/2 fatigue. All activities performed in 30-60 sec. Intervals: Barre curl in hooklying Clamshells in sidelying with yellow band Clamshells in sidelying with feet in air with yellow band Sidelying hip abd with internal rotation at hip to target glute med Planks (walk out and walk back to standing, then raising block overhead) Sideplanks with hip dips x10 reps Standing heel raises with 2 inch and 4 inch elevation (knees flexed) Standing hip ext and hamstring curls with LE extended Hip ext in prone (diamond shape) Hip ext/abd in seated leaning forward on forearms Pt also demonstrated her RDL, squats,  hip ext/abd x10 reps/activity with yellow band that she performed at home.  Pt reported 3-4 out of 10 on RPE scale (mild-moderately challenging).                     PT Education - 06/28/21 1418     Education Details PT educated pt on BP and HR before during and after exercise. PT educated pt on focusing on incr. time slowly with workout and attempting no resistance/barre workout prior to weights, as she can modify as needed, especially when lightheaded. PT educated pt on peforming barre (20-30 minutes) three times a week to start and we will reassess pelvic floor next week to ensure tension/pain has not incr. PT educated pt on modifying barre workout (LE, core, hips) as needed, especially when lightheaded.    Person(s) Educated Patient    Methods  Explanation;Demonstration;Tactile cues;Verbal cues;Other (comment)   video   Comprehension Verbalized understanding;Returned demonstration;Need further instruction;Tactile cues required;Verbal cues required              PT Short Term Goals - 03/22/21 1644       PT SHORT TERM GOAL #1   Title see LTG section for all goals.               PT Long Term Goals - 06/28/21 1423       PT LONG TERM GOAL #1   Title Perform FOTO and write goal as indicated. TARGET DATE: 04/05/21    Time 2    Period Weeks    Status Achieved      PT LONG TERM GOAL #2   Title Pt will be IND in HEP to improve strength, balance, posture, and flexibility in order to decr. pain. TARGET DATE: 04/19/21    Time 4    Period Weeks    Status Achieved      PT LONG TERM GOAL #3   Title Pt will demonstrate proper aligment and incr. strength of surrounding pelvic musculature in order to decr. pain. to </=5/10 at worst.    Baseline 9/10 at worst; 11/22: 4-/5 B hip abd/add with improved pelvic alignment but tx spine still hypomobile and with kyphosis with pt reporting pelvic pain incr. to 9/10 at night.    Time 4    Period Weeks    Status On-going    Target Date 07/12/21      PT LONG TERM GOAL #4   Title Pt will verbalize mindfulness technique to decr. pelvic floor tension and pain to improve QOL.    Time 4    Period Weeks    Status On-going    Target Date 07/12/21      PT LONG TERM GOAL #5   Title Pt will demonstrate proper coordination of pelvic floor and proper toileting posture in order to not experience pain during bowel movement.    Time 4    Period Weeks    Status On-going    Target Date 07/12/21      PT LONG TERM GOAL #6   Title Pt will improve FOTO score to </= 75 on PFDI pain section to improve QOL.    Baseline 100    Time 4    Period Weeks    Status On-going    Target Date 07/12/21                   Plan - 06/28/21 1420     Clinical Impression Statement Today's skilled  session focused on improving  strength to decr. tension on pelvic floor musculature. Pt demonstrated progress as she was able to tolerate strength training (barre activities focused on core, B LEs, and hip strength). Pt's systolic BP and HR went up WNL. however, diastolic BP decr. intially during workout but incr. after performing sidelying exercises. Pt is f/u with cardiologist today re: monitor and echo results. Pt continues to require rest breaks to ensure proper form and has pelvic pain, although pain has improved. Pt would continue to benefit from skilled PT to improve deficits listed above.    PT Treatment/Interventions ADLs/Self Care Home Management;Gait training;Stair training;Functional mobility training;Therapeutic activities;Neuromuscular re-education;Manual techniques;Patient/family education;Therapeutic exercise;Balance training    PT Next Visit Plan Internal muscle assessment. Review barre workout and progress prn. manual therapy: diaphragm, tx spine, hips.    PT Home Exercise Plan [id number]    Consulted and Agree with Plan of Care Patient;Family member/caregiver             Patient will benefit from skilled therapeutic intervention in order to improve the following deficits and impairments:  Abnormal gait, Decreased balance, Decreased endurance, Impaired sensation, Decreased coordination, Decreased strength, Impaired flexibility, Postural dysfunction, Pain  Visit Diagnosis: Other lack of coordination  Other abnormalities of gait and mobility  Chronic low back pain, unspecified back pain laterality, unspecified whether sciatica present  Pain in left hip  Pain in right hip  Full incontinence of feces     Problem List Patient Active Problem List   Diagnosis Date Noted   Pelvic pain    Irregular menses 03/29/2018    Yanky Vanderburg L, PT 06/28/2021, 2:24 PM  [place] [address] [place], [place],  [address] Phone: [phone]   Fax:  [phone]  Name: BRIELL REVOLORIO MRN: [id number] Date of Birth: May 25, 2001  Geoffry Paradise, PT,DPT 06/28/21 2:29 PM Phone: [phone] Fax: [phone]

## 2021-07-05 ENCOUNTER — Other Ambulatory Visit: Payer: Self-pay

## 2021-07-05 ENCOUNTER — Ambulatory Visit: Payer: 59

## 2021-07-05 DIAGNOSIS — R278 Other lack of coordination: Secondary | ICD-10-CM | POA: Diagnosis not present

## 2021-07-05 DIAGNOSIS — M545 Low back pain, unspecified: Secondary | ICD-10-CM

## 2021-07-05 DIAGNOSIS — M25552 Pain in left hip: Secondary | ICD-10-CM

## 2021-07-05 DIAGNOSIS — G8929 Other chronic pain: Secondary | ICD-10-CM

## 2021-07-05 DIAGNOSIS — R2689 Other abnormalities of gait and mobility: Secondary | ICD-10-CM

## 2021-07-05 DIAGNOSIS — M25551 Pain in right hip: Secondary | ICD-10-CM

## 2021-07-05 DIAGNOSIS — R159 Full incontinence of feces: Secondary | ICD-10-CM

## 2021-07-05 NOTE — Therapy (Signed)
[facility] MAIN [facility] SERVICES [address] [place], [place], [address] Phone: [phone]   Fax:  [phone]  Physical Therapy Treatment/PROGRESS NOTE  Patient Details  Name: Jennifer Francis MRN: [id number] Date of Birth: [birth year] No data recorded  Encounter Date: 07/05/2021  Progress Note Reporting Period 03/22/22 to 07/05/21  See note below for Objective Data and Assessment of Progress/Goals.       PT End of Session - 07/05/21 1611     Visit Number 10    Number of Visits 11    Date for PT Re-Evaluation 07/12/21    Authorization Type Self pay    Progress Note Due on Visit 10    PT Start Time 1507    PT Stop Time 1556    PT Time Calculation (min) 49 min    Activity Tolerance Patient tolerated treatment well;No increased pain    Behavior During Therapy WFL for tasks assessed/performed             Past Medical History:  Diagnosis Date   Anxiety    no meds   Asthma    no inhalers-well controlled   Heavy periods    Irregular periods    UTI (urinary tract infection)     Past Surgical History:  Procedure Laterality Date   LAPAROSCOPY N/A 02/25/2021   Procedure: LAPAROSCOPY DIAGNOSTIC;  Surgeon: Nadara Mustard, MD;  Location: [facility] ORS;  Service: Gynecology;  Laterality: N/A;   NO PAST SURGERIES      There were no vitals filed for this visit.   Subjective Assessment - 07/05/21 1511     Subjective Pt reported she feels better since sleeping until 11am (per PCP), she hasn't had any flare ups (pelvic pain) since then. Pt has been performing barre workouts and strength training (glutes/hips) every other day. Pt has been salt loading and compression stockings are on the way. Pt reported fainting, lightheadness is still present. Pt reported she has some pain with bowel movements (feels like she is not completely emptying but is not using squatty potty) and then had one night of crampy/achy sensation in lower abdomen. Pt is  wondering if depo could be causing adverse side effects.    Patient is accompained by: --    Pertinent History Anxiety, asthma both well controlled, hx of heavy periods    Patient Stated Goals I want to be able to work out again and decr. pain.                               [facility] Adult PT Treatment/Exercise - 07/05/21 1539       Manual Therapy   Manual Therapy Internal Pelvic Floor;Joint mobilization    Manual therapy comments Pt denied pain after manual therapy.    Joint Mobilization PT assessed B ASIS, PSIS, sacrum, coccyx, ischial tubs. for any issues (with and without moving pt's LEs in flex/ext/abd. No pain noted.    Internal Pelvic Floor PT performed pelvic floor muscle assessment. PT performed trigger point release to R levator ani and coccygeus pt reporting 4/10 (burning) at introitus (and didn't last long) on R side with 0/10 during at introitus with muscle release and decr. tension with manual therapy.                   SELF CARE:  PT Education - 07/05/21 1607     Education Details PT encouraged pt to ask OBGYN  re: changing to a different birth control, as pt is concerned depo could be causing some of her lightheadedness/dizziness and other issues. PT educated pt that havinga  bowel movement three times a day to every three days is WNL and that incr. in bowel movements could be 2/2 working out more. Decr. in pelvic pain could be 2/2 decr. in flare ups, which could be 2/2 pt waking up later and incr. strength in musculature surrounding pelvic floor musculature. PT discussed continuing this POC and then reducing frequency based on progress. PT discussed progressing strength training slowly.    Person(s) Educated Patient    Methods Explanation    Comprehension Verbalized understanding              PT Short Term Goals - 03/22/21 1644       PT SHORT TERM GOAL #1   Title see LTG section for all goals.               PT Long Term Goals -  06/28/21 1423       PT LONG TERM GOAL #1   Title Perform FOTO and write goal as indicated. TARGET DATE: 04/05/21    Time 2    Period Weeks    Status Achieved      PT LONG TERM GOAL #2   Title Pt will be IND in HEP to improve strength, balance, posture, and flexibility in order to decr. pain. TARGET DATE: 04/19/21    Time 4    Period Weeks    Status Achieved      PT LONG TERM GOAL #3   Title Pt will demonstrate proper aligment and incr. strength of surrounding pelvic musculature in order to decr. pain. to </=5/10 at worst.    Baseline 9/10 at worst; 11/22: 4-/5 B hip abd/add with improved pelvic alignment but tx spine still hypomobile and with kyphosis with pt reporting pelvic pain incr. to 9/10 at night.    Time 4    Period Weeks    Status On-going    Target Date 07/12/21      PT LONG TERM GOAL #4   Title Pt will verbalize mindfulness technique to decr. pelvic floor tension and pain to improve QOL.    Time 4    Period Weeks    Status On-going    Target Date 07/12/21      PT LONG TERM GOAL #5   Title Pt will demonstrate proper coordination of pelvic floor and proper toileting posture in order to not experience pain during bowel movement.    Time 4    Period Weeks    Status On-going    Target Date 07/12/21      PT LONG TERM GOAL #6   Title Pt will improve FOTO score to </= 75 on PFDI pain section to improve QOL.    Baseline 100    Time 4    Period Weeks    Status On-going    Target Date 07/12/21                   Plan - 07/05/21 1611     Clinical Impression Statement Pt demonstrated progress as less tension noted during internal muscle assessment. Pt also able to lengthen/relax pelvic floor musculature (PFM) without cues. Less pelvic pain/burning reported during internal muscle assessment today and burning sensation duration shorter with quick release of R levator ani and R coccygeus. Pt continues to have pain with bowel movements but this is likely 2/2  pt not  using stool to placed B knees higher than B hips to decr. pelvic floor tension around rectum. Pt continues to experience intermittent pelvic pain. Pt would continue to benefit from skilled PT to improve strenght and pain. PT will assess remaining LTGs next session and likely reduce frequency to every other week based on progress.    PT Treatment/Interventions ADLs/Self Care Home Management;Gait training;Stair training;Functional mobility training;Therapeutic activities;Neuromuscular re-education;Manual techniques;Patient/family education;Therapeutic exercise;Balance training    PT Next Visit Plan Check LTGs and renew. Internal muscle assessment. Review barre workout and progress prn. manual therapy: diaphragm, tx spine, hips.    PT Home Exercise Plan [email or web address]    Consulted and Agree with Plan of Care Patient;Family member/caregiver             Patient will benefit from skilled therapeutic intervention in order to improve the following deficits and impairments:     Visit Diagnosis: Other lack of coordination  Other abnormalities of gait and mobility  Chronic low back pain, unspecified back pain laterality, unspecified whether sciatica present  Full incontinence of feces  Pain in left hip  Pain in right hip     Problem List Patient Active Problem List   Diagnosis Date Noted   Pelvic pain    Irregular menses 03/29/2018    Miner Koral L, PT 07/05/2021, 4:15 PM  [facility] MAIN [facility] SERVICES [address] [place], [place], [address] Phone: [phone]   Fax:  [phone]  Name: ADISYN RUSCITTI MRN: [id number] Date of Birth: Apr 17, 2001  Zerita Boers, PT,DPT 07/05/21 4:16 PM Phone: [phone] Fax: [phone]

## 2021-07-13 ENCOUNTER — Other Ambulatory Visit: Payer: Self-pay

## 2021-07-13 ENCOUNTER — Ambulatory Visit: Payer: 59 | Attending: Obstetrics & Gynecology

## 2021-07-13 DIAGNOSIS — R159 Full incontinence of feces: Secondary | ICD-10-CM | POA: Insufficient documentation

## 2021-07-13 DIAGNOSIS — R278 Other lack of coordination: Secondary | ICD-10-CM | POA: Insufficient documentation

## 2021-07-13 DIAGNOSIS — M545 Low back pain, unspecified: Secondary | ICD-10-CM | POA: Diagnosis present

## 2021-07-13 DIAGNOSIS — G8929 Other chronic pain: Secondary | ICD-10-CM | POA: Insufficient documentation

## 2021-07-13 DIAGNOSIS — M25552 Pain in left hip: Secondary | ICD-10-CM | POA: Diagnosis present

## 2021-07-13 DIAGNOSIS — M25551 Pain in right hip: Secondary | ICD-10-CM | POA: Insufficient documentation

## 2021-07-13 DIAGNOSIS — R2689 Other abnormalities of gait and mobility: Secondary | ICD-10-CM | POA: Insufficient documentation

## 2021-07-13 NOTE — Therapy (Addendum)
[place] MAIN [facility] SERVICES [address] [place], [place], [address] Phone: [phone]   Fax:  [phone]  Physical Therapy Treatment/RE-CERT  Patient Details  Name: Jennifer Francis MRN: [id number] Date of Birth: 11/02/2000 No data recorded  Encounter Date: 07/13/2021   PT End of Session - 07/13/21 1207     Visit Number 11    Number of Visits 11   requesting add'l visits   Date for PT Re-Evaluation 07/12/21    Authorization Type Self pay    Progress Note Due on Visit 10    PT Start Time 1115   pt late   PT Stop Time 1200    PT Time Calculation (min) 45 min    Activity Tolerance Patient tolerated treatment well;No increased pain    Behavior During Therapy WFL for tasks assessed/performed             Past Medical History:  Diagnosis Date   Anxiety    no meds   Asthma    no inhalers-well controlled   Heavy periods    Irregular periods    UTI (urinary tract infection)     Past Surgical History:  Procedure Laterality Date   LAPAROSCOPY N/A 02/25/2021   Procedure: LAPAROSCOPY DIAGNOSTIC;  Surgeon: Gae Dry, MD;  Location: [facility] ORS;  Service: Gynecology;  Laterality: N/A;   NO PAST SURGERIES      There were no vitals filed for this visit.   Subjective Assessment - 07/13/21 1117     Subjective Pt reported she has been using a stool for bowel movement and it has been going great, she feels more relief. Pt hasn't been able to work out as much 2/2 nausea all last week, it's worse in the morning. She feels better this week, as she's been eating a bit more. Pt has noted that if she gets hot that's when she faints and feels much better when cold. She still has not received compression socks yet. Pt reported she feels at least 50% better since starting PT. Pt reported she's taking gasX and it has been helping esophagus type pain.    Pertinent History Anxiety, asthma both well controlled, hx of heavy periods    Patient Stated  Goals I want to be able to work out again and decr. pain. 07/13/21: she'd like to stop tearing during intercourse.    Currently in Pain? No/denies                               [facility] Adult PT Treatment/Exercise - 07/13/21 1218       Manual Therapy   Manual Therapy Joint mobilization;Soft tissue mobilization    Manual therapy comments Pt denied pain after manual therapy and was able to amb. 2x10' without pain.    Joint Mobilization PT assessed B ASIS, PSIS, sacrum, Tx and Lx spine mobility. No pain noted or hypomobility noted.    Soft tissue mobilization PT performed STM and trigger point release to L hip add. (proximal), and L quad (distal) and R lateral diaphragm with PT providing resistance during deep breaths x 5reps and then without PT providing resistance for 5 reps.            B hip abd/add: 4-/5. Pt demonstrated proper toileting posture.       SELF CARE:  PT Education - 07/13/21 1203     Education Details PT encouraged pt to perform barre strengthening  vs. her traditional strength training to slowly ease back into workout routine, especially with nausea and pain. PT educated pt on LTG progress and renewing for additional 8 weeks. PT discussed toileting posture again and pt was able to properly demonstrate.    Person(s) Educated Patient    Methods Explanation    Comprehension Verbalized understanding              PT Short Term Goals - 07/13/21 1211       PT SHORT TERM GOAL #1   Title Pt will be IND in progressed HEP to decr. pain, improve strength and flexibility.    Time 4    Period Weeks    Status New    Target Date 08/10/21      PT SHORT TERM GOAL #2   Title Pt will demonstrate proper pelvic floor musculature coordination and report she is able to fully evacuate stool without straining to decr. pain.    Time 4    Period Weeks    Status New      PT SHORT TERM GOAL #3   Title Pt will report joining the gym and workout for 30 minutes  while demonstrate proper lifting posture to decr. pelvic floor and back pain in order to maintain gains made in PT and to improve QOL.    Time 4    Period Weeks    Status New               PT Long Term Goals - 07/13/21 1120       PT LONG TERM GOAL #1   Title Perform FOTO and write goal as indicated. TARGET DATE: 04/05/21    Time 2    Period Weeks    Status Achieved      PT LONG TERM GOAL #2   Title Pt will be IND in HEP to improve strength, balance, posture, and flexibility in order to decr. pain. TARGET DATE: 04/19/21    Time 4    Period Weeks    Status Achieved      PT LONG TERM GOAL #3   Title Pt will demonstrate proper aligment and incr. strength of surrounding pelvic musculature in order to decr. pain. to </=5/10 at worst. ALL UNMET OR ON-GOING/DEFERRED LTGS WILL BE CARRIED OVER TO NEW POC.    Baseline 9/10 at worst; 11/22: 4-/5 B hip abd/add with improved pelvic alignment but tx spine still hypomobile and with kyphosis with pt reporting pelvic pain incr. to 9/10 at night. 07/13/21: 7-8/10 at worst, on average: 3-4/10 with 4-/5 B hip add/abd.    Time 4    Period Weeks    Status Not Met    Target Date 07/12/21      PT LONG TERM GOAL #4   Title Pt will verbalize mindfulness technique to decr. pelvic floor tension and pain to improve QOL.    Baseline able to verbalize techniques.    Time 4    Period Weeks    Status Achieved    Target Date 07/12/21      PT LONG TERM GOAL #5   Title Pt will demonstrate proper coordination of pelvic floor and proper toileting posture in order to not experience pain during bowel movement.    Time 4    Period Weeks    Status Achieved    Target Date 07/12/21      Additional Long Term Goals   Additional Long Term Goals Yes      PT LONG TERM  GOAL #6   Title Pt will improve FOTO score to </= 75 on PFDI pain section to improve QOL.    Baseline 100    Time 4    Period Weeks    Status On-going    Target Date 07/12/21      PT LONG TERM  GOAL #7   Title Pt will demonstrate ability to coordinate pelvic floor muscle to lengthen and contract in order to reduce tension and minimize tearing with intercourse, OBGYN exam and tampon insertion.    Time 8    Period Weeks    Status New      PT LONG TERM GOAL #8   Title Pt will report going to the gym and working out for >/= 60 minutes while demonstrating proper lifting posture to decr. pelvic floor and back pain in order to maintain gains made in PT and to improve QOL.    Time 8    Period Weeks    Status New                   Plan - 07/13/21 1207     Clinical Impression Statement Pt demonstrated progress as all LTGs met except for LTG 3 and LTG 6 (FOTO) deferred as PT is asking for additional visits. Pt continues to experience intermittent pelvic pain, tearing with intercourse and tampons, decr. strength (particulary in B hip musculature), hypomobility of spine, decr. flexibility and cues to improve posture. Therefore, PT requesting additional 1x/week for 8 weeks to improve deficits listed above.    PT Frequency 1x / week    PT Duration 8 weeks   requesting 1x/week for 8 weeks for a total of 8 more visits.   PT Treatment/Interventions ADLs/Self Care Home Management;Gait training;Stair training;Functional mobility training;Therapeutic activities;Neuromuscular re-education;Manual techniques;Patient/family education;Therapeutic exercise;Balance training    PT Next Visit Plan Internal muscle assessment. Review barre workout and progress prn. manual therapy: diaphragm, tx spine, hips.    PT Home Exercise Plan [email or web address]    Consulted and Agree with Plan of Care Patient;Family member/caregiver             Patient will benefit from skilled therapeutic intervention in order to improve the following deficits and impairments:     Visit Diagnosis: Other lack of coordination - Plan: PT plan of care cert/re-cert  Other abnormalities of gait and mobility - Plan: PT plan of care  cert/re-cert  Chronic low back pain, unspecified back pain laterality, unspecified whether sciatica present - Plan: PT plan of care cert/re-cert  Full incontinence of feces - Plan: PT plan of care cert/re-cert  Pain in left hip - Plan: PT plan of care cert/re-cert     Problem List Patient Active Problem List   Diagnosis Date Noted   Pelvic pain    Irregular menses 03/29/2018    Madalina Rosman L, PT 07/13/2021, 12:23 PM  [place] [address] [place], [place], [address] Phone: [phone]   Fax:  [phone]  Name: YTZEL GUBLER MRN: [id number] Date of Birth: 10-30-2000   Geoffry Paradise, PT,DPT 07/13/21 12:24 PM Phone: [phone] Fax: [phone]

## 2021-07-27 ENCOUNTER — Ambulatory Visit: Payer: 59

## 2021-07-27 ENCOUNTER — Other Ambulatory Visit: Payer: Self-pay

## 2021-07-27 DIAGNOSIS — R278 Other lack of coordination: Secondary | ICD-10-CM | POA: Diagnosis not present

## 2021-07-27 DIAGNOSIS — M545 Low back pain, unspecified: Secondary | ICD-10-CM

## 2021-07-27 DIAGNOSIS — M25551 Pain in right hip: Secondary | ICD-10-CM

## 2021-07-27 DIAGNOSIS — R159 Full incontinence of feces: Secondary | ICD-10-CM

## 2021-07-27 DIAGNOSIS — M25552 Pain in left hip: Secondary | ICD-10-CM

## 2021-07-27 DIAGNOSIS — R2689 Other abnormalities of gait and mobility: Secondary | ICD-10-CM

## 2021-07-27 DIAGNOSIS — G8929 Other chronic pain: Secondary | ICD-10-CM

## 2021-07-27 NOTE — Therapy (Signed)
[place] MAIN [facility] SERVICES [address] [place], [place], [address] Phone: [phone]   Fax:  [phone]  Physical Therapy Treatment  Patient Details  Name: Jennifer Francis MRN: [id number] Date of Birth: 2001/03/15 No data recorded  Encounter Date: 07/27/2021   PT End of Session - 07/27/21 1120     Visit Number 12    Number of Visits 19    Date for PT Re-Evaluation 09/12/21    Authorization Type Self pay    Progress Note Due on Visit 10    PT Start Time 1101    PT Stop Time 1155    PT Time Calculation (min) 54 min    Activity Tolerance Patient tolerated treatment well;No increased pain    Behavior During Therapy WFL for tasks assessed/performed             Past Medical History:  Diagnosis Date   Anxiety    no meds   Asthma    no inhalers-well controlled   Heavy periods    Irregular periods    UTI (urinary tract infection)     Past Surgical History:  Procedure Laterality Date   LAPAROSCOPY N/A 02/25/2021   Procedure: LAPAROSCOPY DIAGNOSTIC;  Surgeon: Gae Dry, MD;  Location: [facility] ORS;  Service: Gynecology;  Laterality: N/A;   NO PAST SURGERIES      There were no vitals filed for this visit.   Subjective Assessment - 07/27/21 1104     Subjective Pt reported she's emotional today 2/2 her ex is moving out of their shared appt. Pt has started going to mental health therapy and has an appt. today at 4pm. She has only been walking but not strength training 2/2 she keeps waking up at 6am and is very fatigue.  Pt has her compression socks donned just distal to B knees, however, the pressure is only mild (20 mmHg) vs. moderate since 2/15 and has not noticed a difference. Pt reported pelvic pain has been more tolerable but more bowel issues, if pt wakes up prior to 11am she has diarrhea but if it's after 11am she does not have diarrhea. Pt did say at end of session she performed one barre strengthening in last week (20-30  minutes).    Pertinent History Anxiety, asthma both well controlled, hx of heavy periods    Patient Stated Goals I want to be able to work out again and decr. pain. 07/13/21: she'd like to stop tearing during intercourse.    Currently in Pain? No/denies                             manual therapy and NMR: angels. (Cues for technique without FHP).  [facility] Adult PT Treatment/Exercise - 07/27/21 1141       Manual Therapy   Manual Therapy Joint mobilization;Soft tissue mobilization    Manual therapy comments Pt reported tx spine pain during manual therapy but tolerable which decr. after heat pad (with layers) placed tx spine.    Joint Mobilization PT assessed B ASIS, PSIS, sacrum, Tx and Lx spine mobility. Incr. hypomobility noted at T2-T6, 3x30sec. L post. rotation noted at sacrum, PT placed washcloth over L sacrum for 5 minutes. NMR: pt performed supine angels and seated angels to maintain gains made during tx spine jt mobs. x10 reps/position.    Soft tissue mobilization PT performed STM to L distal quad and B piriformis.  Self care:  PT Education - 07/27/21 1119     Education Details PT discussed different levels of compression as pt's new compression socks feels mild and pt has not noticed any difference in s/s, so PT encouraged pt to ask MD re: ideal compression for orthostasis. PT reiterated the importance of posture to decr. hip/sacrum hypomobility on one side. PT educated on trialing HEP again for strength training.    Person(s) Educated Patient    Methods Explanation    Comprehension Verbalized understanding              PT Short Term Goals - 07/13/21 1211       PT SHORT TERM GOAL #1   Title Pt will be IND in progressed HEP to decr. pain, improve strength and flexibility.    Time 4    Period Weeks    Status New    Target Date 08/10/21      PT SHORT TERM GOAL #2   Title Pt will demonstrate proper pelvic floor musculature  coordination and report she is able to fully evacuate stool without straining to decr. pain.    Time 4    Period Weeks    Status New      PT SHORT TERM GOAL #3   Title Pt will report joining the gym and workout for 30 minutes while demonstrate proper lifting posture to decr. pelvic floor and back pain in order to maintain gains made in PT and to improve QOL.    Time 4    Period Weeks    Status New               PT Long Term Goals - 07/13/21 1120       PT LONG TERM GOAL #1   Title Perform FOTO and write goal as indicated. TARGET DATE: 04/05/21    Time 2    Period Weeks    Status Achieved      PT LONG TERM GOAL #2   Title Pt will be IND in HEP to improve strength, balance, posture, and flexibility in order to decr. pain. TARGET DATE: 04/19/21    Time 4    Period Weeks    Status Achieved      PT LONG TERM GOAL #3   Title Pt will demonstrate proper aligment and incr. strength of surrounding pelvic musculature in order to decr. pain. to </=5/10 at worst. ALL UNMET OR ON-GOING/DEFERRED LTGS WILL BE CARRIED OVER TO NEW POC.    Baseline 9/10 at worst; 11/22: 4-/5 B hip abd/add with improved pelvic alignment but tx spine still hypomobile and with kyphosis with pt reporting pelvic pain incr. to 9/10 at night. 07/13/21: 7-8/10 at worst, on average: 3-4/10 with 4-/5 B hip add/abd.    Time 4    Period Weeks    Status Not Met    Target Date 07/12/21      PT LONG TERM GOAL #4   Title Pt will verbalize mindfulness technique to decr. pelvic floor tension and pain to improve QOL.    Baseline able to verbalize techniques.    Time 4    Period Weeks    Status Achieved    Target Date 07/12/21      PT LONG TERM GOAL #5   Title Pt will demonstrate proper coordination of pelvic floor and proper toileting posture in order to not experience pain during bowel movement.    Time 4    Period Weeks    Status Achieved  Target Date 07/12/21      Additional Long Term Goals   Additional Long  Term Goals Yes      PT LONG TERM GOAL #6   Title Pt will improve FOTO score to </= 75 on PFDI pain section to improve QOL.    Baseline 100    Time 4    Period Weeks    Status On-going    Target Date 07/12/21      PT LONG TERM GOAL #7   Title Pt will demonstrate ability to coordinate pelvic floor muscle to lengthen and contract in order to reduce tension and minimize tearing with intercourse, OBGYN exam and tampon insertion.    Time 8    Period Weeks    Status New      PT LONG TERM GOAL #8   Title Pt will report going to the gym and working out for >/= 60 minutes while demonstrating proper lifting posture to decr. pelvic floor and back pain in order to maintain gains made in PT and to improve QOL.    Time 8    Period Weeks    Status New                   Plan - 07/27/21 1159     Clinical Impression Statement Today's skilled session focused on manual therapy to decr. soft tissue restrictions in hips and LE and improve spine mobility, which can impact pelvic floor musculature. Pt's upper tx spine noted to be hypomobile and L sacrum in post. rotation. Pt also noted to have incr. tension in B piriformis and L distal quad. Pt tolerated session well but continues to have intermittent pelvic and back pain. Pt would continue to benefit from skilled PT to improve pain, strength, ROM, posture and bowel movement.    PT Treatment/Interventions ADLs/Self Care Home Management;Gait training;Stair training;Functional mobility training;Therapeutic activities;Neuromuscular re-education;Manual techniques;Patient/family education;Therapeutic exercise;Balance training    PT Next Visit Plan Internal muscle assessment. Review barre workout and progress prn. manual therapy: diaphragm, tx spine, hips.    PT Home Exercise Plan [email or web address]    Consulted and Agree with Plan of Care Patient;Family member/caregiver             Patient will benefit from skilled therapeutic intervention in order to improve  the following deficits and impairments:  Abnormal gait, Decreased balance, Decreased endurance, Impaired sensation, Decreased coordination, Decreased strength, Impaired flexibility, Postural dysfunction, Pain  Visit Diagnosis: Other abnormalities of gait and mobility  Chronic low back pain, unspecified back pain laterality, unspecified whether sciatica present  Pain in left hip  Pain in right hip  Other lack of coordination  Full incontinence of feces     Problem List Patient Active Problem List   Diagnosis Date Noted   Pelvic pain    Irregular menses 03/29/2018    Jermani Pund L, PT 07/27/2021, 12:04 PM  [place] MAIN [facility] SERVICES [address] [place], [place], [address] Phone: [phone]   Fax:  [phone]  Name: Jennifer Francis MRN: [id number] Date of Birth: Oct 24, 2000  Geoffry Paradise, PT,DPT 07/27/21 12:05 PM Phone: [phone] Fax: [phone]

## 2021-08-03 ENCOUNTER — Ambulatory Visit: Payer: 59

## 2021-08-10 ENCOUNTER — Ambulatory Visit: Payer: 59

## 2021-08-16 ENCOUNTER — Ambulatory Visit: Payer: 59 | Attending: Obstetrics & Gynecology

## 2021-08-16 ENCOUNTER — Other Ambulatory Visit: Payer: Self-pay

## 2021-08-16 DIAGNOSIS — M545 Low back pain, unspecified: Secondary | ICD-10-CM | POA: Insufficient documentation

## 2021-08-16 DIAGNOSIS — R278 Other lack of coordination: Secondary | ICD-10-CM | POA: Insufficient documentation

## 2021-08-16 DIAGNOSIS — M25552 Pain in left hip: Secondary | ICD-10-CM | POA: Diagnosis present

## 2021-08-16 DIAGNOSIS — R159 Full incontinence of feces: Secondary | ICD-10-CM | POA: Diagnosis present

## 2021-08-16 DIAGNOSIS — M25551 Pain in right hip: Secondary | ICD-10-CM | POA: Diagnosis present

## 2021-08-16 DIAGNOSIS — G8929 Other chronic pain: Secondary | ICD-10-CM | POA: Diagnosis present

## 2021-08-16 DIAGNOSIS — R2689 Other abnormalities of gait and mobility: Secondary | ICD-10-CM | POA: Diagnosis not present

## 2021-08-16 NOTE — Therapy (Addendum)
[facility] MAIN [facility] SERVICES [address] [place], [place], [address] Phone: [phone]   Fax:  [phone]  Physical Therapy Treatment  Patient Details  Name: Jennifer Francis MRN: [id number] Date of Birth: May 13, 2001 No data recorded  Encounter Date: 08/16/2021   PT End of Session - 08/16/21 1157     Visit Number 13    Number of Visits 19    Date for PT Re-Evaluation 09/12/21    Authorization Type Self pay    Progress Note Due on Visit 10    PT Start Time 1054    PT Stop Time 1153    PT Time Calculation (min) 59 min    Activity Tolerance Patient tolerated treatment well;No increased pain    Behavior During Therapy WFL for tasks assessed/performed             Past Medical History:  Diagnosis Date   Anxiety    no meds   Asthma    no inhalers-well controlled   Heavy periods    Irregular periods    UTI (urinary tract infection)     Past Surgical History:  Procedure Laterality Date   LAPAROSCOPY N/A 02/25/2021   Procedure: LAPAROSCOPY DIAGNOSTIC;  Surgeon: Nadara Mustard, MD;  Location: [facility] ORS;  Service: Gynecology;  Laterality: N/A;   NO PAST SURGERIES      There were no vitals filed for this visit.   Subjective Assessment - 08/16/21 1059     Subjective Pt reported she's been working out and started a steroid medication and feels much better. Pt reported her eyes do dilate more since starting steroid.  Pt's OBGYN is alright with pt switching from depo at next appt, in a few months. She doesn't wake up early any more and that has helped decr. with GI issues and decr. BP.  Pt denied any leakage. Pt reported her pelvic has decr. but is still 4-5/10 on average. Pt really likes she's going to her small gym and feels safe if she has a lightheadedness/dizzy episode. Pt is going to the gym 2-3 days/week. She no longer has coccyx pain. Pt reported she missed last appt, two weeks ago 2/2 reaction to orange juice causing  hives/itchiness.    Pertinent History Anxiety, asthma both well controlled, hx of heavy periods    Patient Stated Goals I want to be able to work out again and decr. pain. 07/13/21: she'd like to stop tearing during intercourse.    Currently in Pain? Yes    Pain Score 4     Pain Location Abdomen    Pain Orientation Lower    Pain Descriptors / Indicators Aching    Pain Onset More than a month ago    Pain Frequency Constant    Aggravating Factors  bowel movement, walking 5+ minutes    Pain Relieving Factors medication, heat, rest              MANUAL THERAPY: PT perfomred internal muscle assessment. No trigger points noted and pt able to coordinate pelvic floor muscle contraction and relaxation. Pt did report burning sensation during insertion of one digit at introitus on R and L side. Pt able to improve PFM endurance contraction to 9 sec. And perform x10 reps of quick PFM contractions. PT assessed pelvis and LEs in standing from A to P and P to A directions. PT assessed B pelvis (ant/post) mobility and tx spine mobility in supine and prone. All WNL with no pain noted.  SELF CARE:  PT Education - 08/16/21 1156     Education Details PT discussed STG progress. PT discuss exam findings. PT discussed ways to modify strength training at gym to decr. pain and improve safety. PT discussed reducing frequency 2/2 pt's progress. Pt agreeable.    Person(s) Educated Patient    Methods Explanation    Comprehension Verbalized understanding           Extensive time discussing weight lifting technique and safety at gym.   PT Short Term Goals - 08/16/21 1120       PT SHORT TERM GOAL #1   Title Pt will be IND in progressed HEP to decr. pain, improve strength and flexibility.    Time 4    Period Weeks    Status Achieved    Target Date 08/10/21      PT SHORT TERM GOAL #2   Title Pt will demonstrate proper pelvic floor musculature coordination and  report she is able to fully evacuate stool without straining to decr. pain.    Baseline pt is able to use squatty potty, coordinate muscles, and she is able to fully evacuate stool approx. 50% of the time    Time 4    Period Weeks    Status Partially Met      PT SHORT TERM GOAL #3   Title Pt will report joining the gym and workout for 30 minutes while demonstrate proper lifting posture to decr. pelvic floor and back pain in order to maintain gains made in PT and to improve QOL.    Baseline pt is going to gym 3x/week and demonstrate proper posture and PFM coordination    Time 4    Period Weeks    Status Achieved               PT Long Term Goals - 07/13/21 1120       PT LONG TERM GOAL #1   Title Perform FOTO and write goal as indicated. TARGET DATE: 04/05/21    Time 2    Period Weeks    Status Achieved      PT LONG TERM GOAL #2   Title Pt will be IND in HEP to improve strength, balance, posture, and flexibility in order to decr. pain. TARGET DATE: 04/19/21    Time 4    Period Weeks    Status Achieved      PT LONG TERM GOAL #3   Title Pt will demonstrate proper aligment and incr. strength of surrounding pelvic musculature in order to decr. pain. to </=5/10 at worst. ALL UNMET OR ON-GOING/DEFERRED LTGS WILL BE CARRIED OVER TO NEW POC.    Baseline 9/10 at worst; 11/22: 4-/5 B hip abd/add with improved pelvic alignment but tx spine still hypomobile and with kyphosis with pt reporting pelvic pain incr. to 9/10 at night. 07/13/21: 7-8/10 at worst, on average: 3-4/10 with 4-/5 B hip add/abd.    Time 4    Period Weeks    Status Not Met    Target Date 07/12/21      PT LONG TERM GOAL #4   Title Pt will verbalize mindfulness technique to decr. pelvic floor tension and pain to improve QOL.    Baseline able to verbalize techniques.    Time 4    Period Weeks    Status Achieved    Target Date 07/12/21      PT LONG TERM GOAL #5   Title Pt will demonstrate proper coordination of pelvic  floor and proper toileting posture in order to not experience pain during bowel movement.    Time 4    Period Weeks    Status Achieved    Target Date 07/12/21      Additional Long Term Goals   Additional Long Term Goals Yes      PT LONG TERM GOAL #6   Title Pt will improve FOTO score to </= 75 on PFDI pain section to improve QOL.    Baseline 100    Time 4    Period Weeks    Status On-going    Target Date 07/12/21      PT LONG TERM GOAL #7   Title Pt will demonstrate ability to coordinate pelvic floor muscle to lengthen and contract in order to reduce tension and minimize tearing with intercourse, OBGYN exam and tampon insertion.    Time 8    Period Weeks    Status New      PT LONG TERM GOAL #8   Title Pt will report going to the gym and working out for >/= 60 minutes while demonstrating proper lifting posture to decr. pelvic floor and back pain in order to maintain gains made in PT and to improve QOL.    Time 8    Period Weeks    Status New                   Plan - 08/16/21 1157     Clinical Impression Statement Pt demonstrated progress as she met STGs 1 and 3, pt partially met STG 2 as she continues to experience intermittent issues with fully evacuating bowels and pelvic pain with bowel movement. Pt's posture, pelvic alignment and mobility WNL during assessment. Pt able to coordinate pelvic floor muscles to contract and relax during internal muscle assessment. Pt's PFM endurance improved as she was able to hold contraction for 9 seconds prior to relaxing 2/2 fatigue. No trigger points noted during internal muscle assessment but some burning at introitus. Pt would continue to benefit from skilled PT to improve pain, strength, and flexibility. PT reducing frequency to every other week for two more visits.    PT Treatment/Interventions ADLs/Self Care Home Management;Gait training;Stair training;Functional mobility training;Therapeutic activities;Neuromuscular  re-education;Manual techniques;Patient/family education;Therapeutic exercise;Balance training    PT Next Visit Plan Internal muscle assessment. Review barre workout and progress prn. manual therapy: diaphragm, tx spine, hips.    PT Home Exercise Plan [id number]    Consulted and Agree with Plan of Care Patient;Family member/caregiver             Patient will benefit from skilled therapeutic intervention in order to improve the following deficits and impairments:  Abnormal gait, Decreased balance, Decreased endurance, Impaired sensation, Decreased coordination, Decreased strength, Impaired flexibility, Postural dysfunction, Pain  Visit Diagnosis: Other abnormalities of gait and mobility  Chronic low back pain, unspecified back pain laterality, unspecified whether sciatica present  Pain in left hip  Pain in right hip  Full incontinence of feces  Other lack of coordination     Problem List Patient Active Problem List   Diagnosis Date Noted   Pelvic pain    Irregular menses 03/29/2018    Quinesha Selinger L, PT 08/16/2021, 12:01 PM  [facility] MAIN [facility] SERVICES [address] [place], [place], [address] Phone: [phone]   Fax:  [phone]  Name: VANNA PETZOLDT MRN: [id number] Date of Birth: 2001-02-11   Zerita Boers, PT,DPT 08/16/21 12:03 PM Phone: [phone] Fax: [phone]

## 2021-08-23 ENCOUNTER — Ambulatory Visit: Payer: 59

## 2021-08-30 ENCOUNTER — Ambulatory Visit: Payer: 59

## 2021-08-30 ENCOUNTER — Other Ambulatory Visit: Payer: Self-pay

## 2021-08-30 DIAGNOSIS — M25552 Pain in left hip: Secondary | ICD-10-CM

## 2021-08-30 DIAGNOSIS — R2689 Other abnormalities of gait and mobility: Secondary | ICD-10-CM

## 2021-08-30 DIAGNOSIS — R159 Full incontinence of feces: Secondary | ICD-10-CM

## 2021-08-30 DIAGNOSIS — R278 Other lack of coordination: Secondary | ICD-10-CM

## 2021-08-30 DIAGNOSIS — M25551 Pain in right hip: Secondary | ICD-10-CM

## 2021-08-30 DIAGNOSIS — G8929 Other chronic pain: Secondary | ICD-10-CM

## 2021-08-30 NOTE — Therapy (Signed)
[place] [facility] ?[place], [place], [address] ?Phone: [phone]   Fax:  [phone] ? ?Physical Therapy Treatment ? ?Patient Details  ?Name: Jennifer Francis ?MRN: [id number] ?Date of Birth: 19-Oct-2000 ?No data recorded ? ?Encounter Date: 08/30/2021 ? ? PT End of Session - 08/30/21 1149   ? ? Visit Number 14   ? Number of Visits 19   ? Date for PT Re-Evaluation 09/12/21   ? Authorization Type Self pay   ? Progress Note Due on Visit 10   ? PT Start Time 1053   pt early  ? PT Stop Time 1146   ? PT Time Calculation (min) 53 min   ? Activity Tolerance Patient tolerated treatment well;No increased pain   ? Behavior During Therapy [facility] for tasks assessed/performed   ? ?  ?  ? ?  ? ? ?Past Medical History:  ?Diagnosis Date  ? Anxiety   ? no meds  ? Asthma   ? no inhalers-well controlled  ? Heavy periods   ? Irregular periods   ? UTI (urinary tract infection)   ? ? ?Past Surgical History:  ?Procedure Laterality Date  ? LAPAROSCOPY N/A 02/25/2021  ? Procedure: LAPAROSCOPY DIAGNOSTIC;  Surgeon: Gae Dry, MD;  Location: [facility] ORS;  Service: Gynecology;  Laterality: N/A;  ? NO PAST SURGERIES    ? ? ?There were no vitals filed for this visit. ? ? Subjective Assessment - 08/30/21 1054   ? ? Subjective Pt reported she had pelvic pain during a bout of constipation, even with pt using a stool. Pain lasted approx. an hour, she took Advil-tylenol and heat which helped decr. pain. Pt reported lifting has been going great and she's going 2-3 days a week, she had a PR. Pt reported she has to get next depo injection (09/06/21). Pt has an appt. wit cardiologist today. Pt reported she's had a lot of anxiety this week but unsure why-she started taking anxiety medication at night as she was waking up at [place].   ? Pertinent History Anxiety, asthma both well controlled, hx of heavy periods   ? Patient Stated Goals I want to be able to work out again and decr. pain. 07/13/21: she'd  like to stop tearing during intercourse.   ? Currently in Pain? Yes   ? Pain Score 4    ? Pain Location Pelvis   ? Pain Orientation Left;Right;Lower   ? Pain Descriptors / Indicators Aching;Nagging   ? Pain Type Chronic pain   ? Pain Onset More than a month ago   ? Pain Frequency Constant   ? Aggravating Factors  bowel movement   ? Pain Relieving Factors medication, heat, rest   ? ?  ?  ? ?  ? ? ?NMR: ?Pt performed lifting techniques (hip thrusts, B and single RDL, landmines, etc.-no weights, just movements) with PT assessing and cuing for proper technique to decr. B genu valgus, core engagement, and decr. Ant. Pelvic tilt.   ?PT assessed spine, B hip and sacrum mobility: WNL and no pain reported ?PT assessed B hip abd/add/ext: 4/5 with no pain reported ? ? ? ? ? ? ? ? ? ? ? ? ? ? ? ? ? [place] Adult PT Treatment/Exercise - 08/30/21 1102   ? ?  ? Manual Therapy  ? Manual Therapy Internal Pelvic Floor   ? Internal Pelvic Floor PT performed pelvic floor muscle assessment. Pt denied pain during palpation of pelvic floor. No  trigger points noted upon exam. Pt able to perform endurance and quick pelvic floor contractions x10 reps.   ? ?  ?  ? ?  ? ? ? ? ? ? ? ? ?SELF CARE: ? PT Education - 08/30/21 1148   ? ? Education Details PT discussed next visit (4/10) potentially being last visit 2/2 progress. We can hold for one month and go from there. PT had pt perform lifting techniques and educated pt on neutral pelvis, deep core engagement and reducing genu valgus. PT educated pt on strength progress and internal muscle assessment findings.   ? Person(s) Educated Patient   ? Methods Explanation;Demonstration;Verbal cues   ? Comprehension Verbalized understanding;Returned demonstration;Need further instruction   ? ?  ?  ? ?  ? ? ? PT Short Term Goals - 08/16/21 1120   ? ?  ? PT SHORT TERM GOAL #1  ? Title Pt will be IND in progressed HEP to decr. pain, improve strength and flexibility.   ? Time 4   ? Period Weeks   ? Status  Achieved   ? Target Date 08/10/21   ?  ? PT SHORT TERM GOAL #2  ? Title Pt will demonstrate proper pelvic floor musculature coordination and report she is able to fully evacuate stool without straining to decr. pain.   ? Baseline pt is able to use squatty potty, coordinate muscles, and she is able to fully evacuate stool approx. 50% of the time   ? Time 4   ? Period Weeks   ? Status Partially Met   ?  ? PT SHORT TERM GOAL #3  ? Title Pt will report joining the gym and workout for 30 minutes while demonstrate proper lifting posture to decr. pelvic floor and back pain in order to maintain gains made in PT and to improve QOL.   ? Baseline pt is going to gym 3x/week and demonstrate proper posture and PFM coordination   ? Time 4   ? Period Weeks   ? Status Achieved   ? ?  ?  ? ?  ? ? ? ? PT Long Term Goals - 07/13/21 1120   ? ?  ? PT LONG TERM GOAL #1  ? Title Perform FOTO and write goal as indicated. TARGET DATE: 04/05/21   ? Time 2   ? Period Weeks   ? Status Achieved   ?  ? PT LONG TERM GOAL #2  ? Title Pt will be IND in HEP to improve strength, balance, posture, and flexibility in order to decr. pain. TARGET DATE: 04/19/21   ? Time 4   ? Period Weeks   ? Status Achieved   ?  ? PT LONG TERM GOAL #3  ? Title Pt will demonstrate proper aligment and incr. strength of surrounding pelvic musculature in order to decr. pain. to </=5/10 at worst. ALL UNMET OR ON-GOING/DEFERRED LTGS WILL BE CARRIED OVER TO NEW POC.   ? Baseline 9/10 at worst; 11/22: 4-/5 B hip abd/add with improved pelvic alignment but tx spine still hypomobile and with kyphosis with pt reporting pelvic pain incr. to 9/10 at night. 07/13/21: 7-8/10 at worst, on average: 3-4/10 with 4-/5 B hip add/abd.   ? Time 4   ? Period Weeks   ? Status Not Met   ? Target Date 07/12/21   ?  ? PT LONG TERM GOAL #4  ? Title Pt will verbalize mindfulness technique to decr. pelvic floor tension and pain to improve QOL.   ?  Baseline able to verbalize techniques.   ? Time 4   ?  Period Weeks   ? Status Achieved   ? Target Date 07/12/21   ?  ? PT LONG TERM GOAL #5  ? Title Pt will demonstrate proper coordination of pelvic floor and proper toileting posture in order to not experience pain during bowel movement.   ? Time 4   ? Period Weeks   ? Status Achieved   ? Target Date 07/12/21   ?  ? Additional Long Term Goals  ? Additional Long Term Goals Yes   ?  ? PT LONG TERM GOAL #6  ? Title Pt will improve FOTO score to </= 75 on PFDI pain section to improve QOL.   ? Baseline 100   ? Time 4   ? Period Weeks   ? Status On-going   ? Target Date 07/12/21   ?  ? PT LONG TERM GOAL #7  ? Title Pt will demonstrate ability to coordinate pelvic floor muscle to lengthen and contract in order to reduce tension and minimize tearing with intercourse, OBGYN exam and tampon insertion.   ? Time 8   ? Period Weeks   ? Status New   ?  ? PT LONG TERM GOAL #8  ? Title Pt will report going to the gym and working out for >/= 60 minutes while demonstrating proper lifting posture to decr. pelvic floor and back pain in order to maintain gains made in PT and to improve QOL.   ? Time 8   ? Period Weeks   ? Status New   ? ?  ?  ? ?  ? ? ? ? ? ? ? ? Plan - 08/30/21 1150   ? ? Clinical Impression Statement Pt demonstrated progress as she was able to perform x10 reps of quick pelvic floor muscle (PFM) contractions without incr. in pain or loss of power. Pt progressed to 10 sec. hold during pelvic floor contraction. Pt demonstrated progress as she reported no pain during PFM assessment and no trigger points noted. Pt also able to coordinate PFM relaxation without cues. Pt did require cues during lifting technique to improve TrA core engagement, decr. B genu valgus and decr. ant. pelvic tilt. Pt improve B hip abd/add strength to 4/5. B hip ext: 4/5. Tx spine, hip and sacrum mobility WNL. Pt would continue to benefit from skilled PT to continue to decr. pelvic pain, posture, strength, and flexibility.   ? PT  Treatment/Interventions ADLs/Self Care Home Management;Gait training;Stair training;Functional mobility training;Therapeutic activities;Neuromuscular re-education;Manual techniques;Patient/family education;Therapeutic exercise;[facility]

## 2021-09-06 ENCOUNTER — Ambulatory Visit (INDEPENDENT_AMBULATORY_CARE_PROVIDER_SITE_OTHER): Payer: 59

## 2021-09-06 VITALS — BP 124/78

## 2021-09-06 DIAGNOSIS — Z3042 Encounter for surveillance of injectable contraceptive: Secondary | ICD-10-CM

## 2021-09-06 MED ORDER — MEDROXYPROGESTERONE ACETATE 150 MG/ML IM SUSP
150.0000 mg | Freq: Once | INTRAMUSCULAR | Status: AC
  Administered 2021-09-06: 150 mg via INTRAMUSCULAR

## 2021-09-06 NOTE — Progress Notes (Signed)
Pt here for depo inj which was given IM left glut.  Pt wiggled a little but tolerated well.  NDC# [id number]  While we were walking to pt's room she asked if her BP could be checked because she had a vagal episode while checking in/waiting.  Her BP was 124/78; she took her pulse and said it all didn't make sense because her pulse was normal too (she timed it on her phone).  When I went in the room to give her the injection she was taking her pulse again because she was still feeling vagalish; she said it was fine and wanted her inj in the left side; I asked if she was sure she wanted to get it today; she was very confident that it would be okay and that if the numbers were low she would not want it today.  Pt states she is trying to see if she has POTTS or not.  Adv pt to contact her doctor and let them know of this episode. ?

## 2021-09-13 ENCOUNTER — Other Ambulatory Visit: Payer: Self-pay

## 2021-09-13 ENCOUNTER — Ambulatory Visit: Payer: 59 | Attending: Obstetrics & Gynecology

## 2021-09-13 DIAGNOSIS — R159 Full incontinence of feces: Secondary | ICD-10-CM | POA: Insufficient documentation

## 2021-09-13 DIAGNOSIS — M545 Low back pain, unspecified: Secondary | ICD-10-CM | POA: Diagnosis present

## 2021-09-13 DIAGNOSIS — G8929 Other chronic pain: Secondary | ICD-10-CM | POA: Insufficient documentation

## 2021-09-13 DIAGNOSIS — R2689 Other abnormalities of gait and mobility: Secondary | ICD-10-CM | POA: Insufficient documentation

## 2021-09-13 DIAGNOSIS — R278 Other lack of coordination: Secondary | ICD-10-CM | POA: Diagnosis present

## 2021-09-13 DIAGNOSIS — M25551 Pain in right hip: Secondary | ICD-10-CM | POA: Diagnosis present

## 2021-09-13 DIAGNOSIS — M25552 Pain in left hip: Secondary | ICD-10-CM | POA: Insufficient documentation

## 2021-09-13 NOTE — Therapy (Signed)
[facility] ?[place], [place], [address] ?Phone: [phone]   Fax:  [phone] ? ?Physical Therapy Treatment ? ?Patient Details  ?Name: Jennifer Francis ?MRN: [id number] ?Date of Birth: 2001/03/28 ?No data recorded ? ?Encounter Date: 09/13/2021 ? ? PT End of Session - 09/13/21 1058   ? ? Visit Number 15   ? Number of Visits 19   ? Date for PT Re-Evaluation 09/12/21   ? Authorization Type Self pay   ? Progress Note Due on Visit 10   ? PT Start Time 1001   ? PT Stop Time 0569   d/c  ? PT Time Calculation (min) 43 min   ? Activity Tolerance Patient tolerated treatment well;No increased pain   ? Behavior During Therapy [facility] for tasks assessed/performed   ? ?  ?  ? ?  ? ? ?Past Medical History:  ?Diagnosis Date  ? Anxiety   ? no meds  ? Asthma   ? no inhalers-well controlled  ? Heavy periods   ? Irregular periods   ? UTI (urinary tract infection)   ? ? ?Past Surgical History:  ?Procedure Laterality Date  ? LAPAROSCOPY N/A 02/25/2021  ? Procedure: LAPAROSCOPY DIAGNOSTIC;  Surgeon: Gae Dry, MD;  Location: [facility] ORS;  Service: Gynecology;  Laterality: N/A;  ? NO PAST SURGERIES    ? ? ?There were no vitals filed for this visit. ? ? Subjective Assessment - 09/13/21 1003   ? ? Subjective Pt reported she's been feeling better and still weight lifting. Pt is still having pelvic pain during bowel movement, she is using a stool. She started a new supplement with probiotics for GI. She sees the GI specialist in June and tilt table in July. Pt reported she had intercourse last week without pain, tearing, or bleeding. She was able to direct and guide partner to go slow.   ? Patient is accompained by: --   student, [place]: [employer] student  ? Pertinent History Anxiety, asthma both well controlled, hx of heavy periods   ? Patient Stated Goals I want to be able to work out again and decr. pain. 07/13/21: she'd like to stop tearing during intercourse.   ? Currently in  Pain? Yes   ? Pain Score 3    3-4/10  ? Pain Location Pelvis   ? Pain Orientation Right;Left;Lower   ? Pain Descriptors / Indicators Aching   ? Pain Type Chronic pain   ? Pain Onset More than a month ago   ? Pain Frequency Constant   ? Aggravating Factors  bowel movement   ? Pain Relieving Factors medication, heat, rest   ? ?  ?  ? ?  ? ? ? ? ? ? ? ? ? ? ?NMR: ?Access Code: [email or web address] ?URL: [email or web address] ?Date: 09/13/2021 ?Prepared by: Geoffry Paradise ? ?Exercises ?- Pelvic Floor Relaxation Supported Sitting in Chair  - 1 x daily - 7 x weekly - 1 sets - 10 reps ?- Diaphragmatic Breathing in Child's Pose with Pelvic Floor Relaxation  - 1 x daily - 7 x weekly - 1 sets - 10 reps ?- Supine Butterfly Groin Stretch  - 1 x daily - 7 x weekly - 1 sets - 3 reps - 30-60 hold ?- Supine Diaphragmatic Breathing  - 1 x daily - 7 x weekly - 1 sets - 5 reps ?- Supine Pelvic Floor Stretch  - 1 x daily - 7 x weekly - 1 sets - 3  reps - 30-60 hold ?- Supine Angels  - 1 x daily - 7 x weekly - 1 sets - 10 reps ?- Sidelying Open Book  - 1 x daily - 7 x weekly - 1 sets - 10 reps ? ?Review only except for plank and side plank variation. Cues for proper technique.  ? ? ? ? ?Manual therapy: ?Internal PFM assessment: ?No tone noted R or L side of PFM, some burning reported during palpation over R levator ani but tolerable. Otherwise, no pain noted.  ?Pt able to perform 10 quick PFM contractions and 10 sec. Hold with decr. Strength after 5 sec. Hold. ? ? ? ? ? ? ? ? ? ? ? ?Self care: ? PT Education - 09/13/21 1057   ? ? Education Details PT discussed holding for one month and then d/c based on goal progress. Pt agreeable. PT educated on side plank variations. PT educated pt on goal progress and FOTO outcome measure progress.   ? Person(s) Educated Patient   ? Methods Explanation;Verbal cues   ? Comprehension Verbalized understanding;Returned demonstration;Need further instruction   ? ?  ?  ? ?  ? ? ? PT Short Term Goals -  08/16/21 1120   ? ?  ? PT SHORT TERM GOAL #1  ? Title Pt will be IND in progressed HEP to decr. pain, improve strength and flexibility.   ? Time 4   ? Period Weeks   ? Status Achieved   ? Target Date 08/10/21   ?  ? PT SHORT TERM GOAL #2  ? Title Pt will demonstrate proper pelvic floor musculature coordination and report she is able to fully evacuate stool without straining to decr. pain.   ? Baseline pt is able to use squatty potty, coordinate muscles, and she is able to fully evacuate stool approx. 50% of the time   ? Time 4   ? Period Weeks   ? Status Partially Met   ?  ? PT SHORT TERM GOAL #3  ? Title Pt will report joining the gym and workout for 30 minutes while demonstrate proper lifting posture to decr. pelvic floor and back pain in order to maintain gains made in PT and to improve QOL.   ? Baseline pt is going to gym 3x/week and demonstrate proper posture and PFM coordination   ? Time 4   ? Period Weeks   ? Status Achieved   ? ?  ?  ? ?  ? ? ? ? PT Long Term Goals - 09/13/21 1011   ? ?  ? PT LONG TERM GOAL #1  ? Title Perform FOTO and write goal as indicated. TARGET DATE: 04/05/21   ? Time 2   ? Period Weeks   ? Status Achieved   ?  ? PT LONG TERM GOAL #2  ? Title Pt will be IND in HEP to improve strength, balance, posture, and flexibility in order to decr. pain. TARGET DATE: 04/19/21   ? Time 4   ? Period Weeks   ? Status Achieved   ?  ? PT LONG TERM GOAL #3  ? Title Pt will demonstrate proper aligment and incr. strength of surrounding pelvic musculature in order to decr. pain. to </=5/10 at worst. ALL UNMET OR ON-GOING/DEFERRED LTGS WILL BE CARRIED OVER TO NEW POC.   ? Baseline 9/10 at worst; 11/22: 4-/5 B hip abd/add with improved pelvic alignment but tx spine still hypomobile and with kyphosis with pt reporting pelvic pain incr. to  9/10 at night. 07/13/21: 7-8/10 at worst, on average: 3-4/10 with 4-/5 B hip add/abd. 4/10: 3-4/10, good alignment and WNL for Tx spine. 4/5 B hip abd/add.   ? Time 4   ?  Period Weeks   ? Status Achieved   ? Target Date 07/12/21   ?  ? PT LONG TERM GOAL #4  ? Title Pt will verbalize mindfulness technique to decr. pelvic floor tension and pain to improve QOL.   ? Baseline able to verbalize techniques.   ? Time 4   ? Period Weeks   ? Status Achieved   ? Target Date 07/12/21   ?  ? PT LONG TERM GOAL #5  ? Title Pt will demonstrate proper coordination of pelvic floor and proper toileting posture in order to not experience pain during bowel movement.   ? Time 4   ? Period Weeks   ? Status Achieved   ? Target Date 07/12/21   ?  ? PT LONG TERM GOAL #6  ? Title Pt will improve FOTO score to </= 75 on PFDI pain section to improve QOL.   ? Baseline 100. 4/10: 46   ? Time 4   ? Period Weeks   ? Status Achieved   ? Target Date 07/12/21   ?  ? PT LONG TERM GOAL #7  ? Title Pt will demonstrate ability to coordinate pelvic floor muscle to lengthen and contract in order to reduce tension and minimize tearing with intercourse, OBGYN exam and tampon insertion.   ? Time 8   ? Period Weeks   ? Status Achieved   ?  ? PT LONG TERM GOAL #8  ? Title Pt will report going to the gym and working out for >/= 60 minutes while demonstrating proper lifting posture to decr. pelvic floor and back pain in order to maintain gains made in PT and to improve QOL.   ? Time 8   ? Period Weeks   ? Status Achieved   ? ?  ?  ? ?  ? ? ? ? ? ? ? ? Plan - 09/13/21 1059   ? ? Clinical Impression Statement Pt met all LTGs, therefore PT placing pt on hold for one month and will d/c if symptoms do not return. Pt's spine mobility WNL as well as pt's alignment. Pt's B hip abd/add improved as well as B quad and hamstring strength. Pt able to participate in intercourse without pain and has OBGYN exam next month. Pt on hold for one month, then will likely d/c.   ? PT Treatment/Interventions ADLs/Self Care Home Management;Gait training;Stair training;Functional mobility training;Therapeutic activities;Neuromuscular re-education;Manual  techniques;Patient/family education;Therapeutic exercise;Balance training   ? PT Next Visit Plan hold and then d/c prn   ? PT Sp[place]   ? Consulted and Agree with Plan of Care Patient   ? ?

## 2021-09-27 ENCOUNTER — Ambulatory Visit: Payer: 59

## 2021-10-25 ENCOUNTER — Ambulatory Visit: Payer: 59

## 2021-11-13 ENCOUNTER — Ambulatory Visit
Admission: EM | Admit: 2021-11-13 | Discharge: 2021-11-13 | Disposition: A | Discharge status: Home / Self Care | Payer: 59 | Attending: Internal Medicine | Admitting: Internal Medicine

## 2021-11-13 DIAGNOSIS — Z87898 Personal history of other specified conditions: Secondary | ICD-10-CM | POA: Diagnosis not present

## 2021-11-13 DIAGNOSIS — R110 Nausea: Secondary | ICD-10-CM | POA: Diagnosis not present

## 2021-11-13 MED ORDER — ONDANSETRON 8 MG PO TBDP: 8.0000 mg | ORAL_TABLET | Freq: Three times a day (TID) | ORAL | 0 refills | Status: AC | PRN

## 2021-11-13 NOTE — ED Provider Notes (Signed)
[facility]    CSN: [id number] Arrival date & time: 11/13/21  1354      History   Chief Complaint Chief Complaint  Patient presents with   Loss of Consciousness   Medication Refill    HPI Jennifer Francis is a 21 y.o. female who presents requesting refill of Zofran. She is moving and she packed it, and her Chronic nausea is bothering her a lot. Has hx of chronic syncope and has had a full cardiology work up with suspect POTS, has not had MRI of head though she has asked her PCP to order one. Has had negative endoscopy for chronic nausea and fever post syncope. Does not get periods since on Depo the past 3 years   Past Medical History:  Diagnosis Date   Anxiety    no meds   Asthma    no inhalers-well controlled   Heavy periods    Irregular periods    UTI (urinary tract infection)     Patient Active Problem List   Diagnosis Date Noted   Pelvic pain    Irregular menses 03/29/2018    Past Surgical History:  Procedure Laterality Date   LAPAROSCOPY N/A 02/25/2021   Procedure: LAPAROSCOPY DIAGNOSTIC;  Surgeon: Nadara Mustard, MD;  Location: [facility];  Service: Gynecology;  Laterality: N/A;   NO PAST SURGERIES      OB History     Gravida  0   Para  0   Term  0   Preterm  0   AB  0   Living  0      SAB  0   IAB  0   Ectopic  0   Multiple  0   Live Births  0            Home Medications    Prior to Admission medications   Medication Sig Start Date End Date Taking? Authorizing Provider  acetaminophen (TYLENOL) 500 MG tablet Take 1,000 mg by mouth 2 (two) times daily. Up to 3,000 mg   Yes [provider]  ALPRAZolam (XANAX) 0.25 MG tablet Take 0.25 mg by mouth at bedtime as needed for anxiety.   Yes [provider]  fludrocortisone (FLORINEF) 0.1 MG tablet Take 0.1 mg by mouth daily.   Yes [provider]  hydrOXYzine (ATARAX) 25 MG tablet Take 25 mg by mouth 3 (three) times daily as needed.   Yes [provider]  ibuprofen (ADVIL) 200 MG tablet Take 200 mg by mouth every 6 (six) hours as needed. With tylenol   Yes [provider]  medroxyPROGESTERone (DEPO-PROVERA) 150 MG/ML injection Inject 1 mL (150 mg total) into the muscle every 3 (three) months. 03/04/21  Yes Nadara Mustard, MD  ondansetron (ZOFRAN-ODT) 8 MG disintegrating tablet Take 1 tablet (8 mg total) by mouth every 8 (eight) hours as needed for nausea or vomiting. 11/13/21  Yes Rodriguez-Southworth, Nettie Elm, PA-C  oxyCODONE-acetaminophen (PERCOCET/ROXICET) 5-325 MG tablet Take by mouth every 4 (four) hours as needed for severe pain.   Yes [provider]    Family History Family History  Problem Relation Age of Onset   Breast cancer Maternal Grandmother        not sure of age   Diabetes Maternal Grandmother    Hypertension Maternal Grandfather    Ovarian cancer Neg Hx    Colon cancer Neg Hx     Social History Social History   Tobacco Use   Smoking status: Never  Smokeless tobacco: Never  Vaping Use   Vaping Use: Never used  Substance Use Topics   Alcohol use: Never   Drug use: Never     Allergies   Sulfa antibiotics   Review of Systems Review of Systems  Constitutional:  Negative for fever.  Respiratory:  Negative for chest tightness and shortness of breath.   Cardiovascular:  Negative for chest pain.  Gastrointestinal:  Positive for nausea. Negative for abdominal pain and vomiting.  Neurological:  Positive for syncope. Negative for headaches.     Physical Exam Triage Vital Signs ED Triage Vitals  Enc Vitals Group     BP 11/13/21 1414 103/75     Pulse Rate 11/13/21 1414 83     Resp 11/13/21 1414 18     Temp 11/13/21 1414 99 F (37.2 C)     Temp Source 11/13/21 1414 Oral     SpO2 11/13/21 1414 96 %     Weight 11/13/21 1411 135 lb (61.2 kg)     Height 11/13/21 1411 5\' 2"  (1.575 m)     Head Circumference --      Peak Flow --      Pain Score 11/13/21 1411 5     Pain Loc --       Pain Edu? --      Excl. in [place]? --    No data found.  Updated Vital Signs BP 103/75 (BP Location: Left Arm)   Pulse 83   Temp 99 F (37.2 C) (Oral)   Resp 18   Ht 5\' 2"  (1.575 m)   Wt 135 lb (61.2 kg)   SpO2 96%   BMI 24.69 kg/m   Visual Acuity Right Eye Distance:   Left Eye Distance:   Bilateral Distance:    Right Eye Near:   Left Eye Near:    Bilateral Near:     Physical Exam Constitutional:      General: She is not in acute distress.    Appearance: She is normal weight. She is not toxic-appearing.  HENT:     Right Ear: Tympanic membrane, ear canal and external ear normal.     Left Ear: Tympanic membrane, ear canal and external ear normal.  Eyes:     General: No scleral icterus.    Conjunctiva/sclera: Conjunctivae normal.  Cardiovascular:     Rate and Rhythm: Normal rate and regular rhythm.     Comments: 1/6 murmur heard when she is laying down Pulmonary:     Effort: Pulmonary effort is normal.  Abdominal:     General: Abdomen is flat. Bowel sounds are normal.     Palpations: Abdomen is soft. There is no mass.     Tenderness: There is no abdominal tenderness. There is no guarding or rebound.  Musculoskeletal:        General: Normal range of motion.     Cervical back: Neck supple.  Skin:    General: Skin is warm and dry.  Neurological:     Mental Status: She is alert and oriented to person, place, and time.     Gait: Gait normal.     Comments: No face asymmetry  Psychiatric:        Mood and Affect: Mood normal.        Behavior: Behavior normal.        Thought Content: Thought content normal.        Judgment: Judgment normal.      UC Treatments / Results  Labs (all labs ordered  are listed, but only abnormal results are displayed) Labs Reviewed - No data to display  EKG   Radiology No results found.  Procedures Procedures (including critical care time)  Medications Ordered in UC Medications - No data to display  Initial Impression /  Assessment and Plan / UC Course  I have reviewed the triage vital signs and the nursing notes. I refilled her Zofran as noted.  She was explained she needs to finish her work up for Syncope.     Final Clinical Impressions(s) / UC Diagnoses   Final diagnoses:  Nausea without vomiting  History of syncope     Discharge Instructions      You need to finish your work up for syncope which is MRI of your brain next    ED Prescriptions     Medication Sig Dispense Auth. Provider   ondansetron (ZOFRAN-ODT) 8 MG disintegrating tablet Take 1 tablet (8 mg total) by mouth every 8 (eight) hours as needed for nausea or vomiting. 10 tablet Rodriguez-Southworth, Nettie ElmSylvia, PA-C      PDMP not reviewed this encounter.   Garey HamRodriguez-Southworth, Enda Santo, PA-C 11/13/21 1715

## 2021-11-13 NOTE — Discharge Instructions (Addendum)
You need to finish your work up for syncope which is MRI of your brain next

## 2021-11-13 NOTE — ED Triage Notes (Signed)
Pt c/o possible POTS and has been on florinef.  Pt is currently moving and a lot of her medication is packed away. Pt states that she has had a lot of fainting episodes recently and last fainted this morning at 10am.

## 2021-11-13 NOTE — ED Triage Notes (Signed)
Pt asks for a refill of her Zofran Medication

## 2021-12-01 ENCOUNTER — Ambulatory Visit: Payer: 59

## 2022-03-07 ENCOUNTER — Emergency Department
Admission: EM | Admit: 2022-03-07 | Discharge: 2022-03-07 | Disposition: A | Discharge status: Home / Self Care | Payer: Medicaid Other | Attending: Emergency Medicine | Admitting: Emergency Medicine

## 2022-03-07 ENCOUNTER — Other Ambulatory Visit: Payer: Self-pay

## 2022-03-07 DIAGNOSIS — R112 Nausea with vomiting, unspecified: Secondary | ICD-10-CM | POA: Insufficient documentation

## 2022-03-07 DIAGNOSIS — R102 Pelvic and perineal pain: Secondary | ICD-10-CM | POA: Diagnosis not present

## 2022-03-07 DIAGNOSIS — R55 Syncope and collapse: Secondary | ICD-10-CM | POA: Insufficient documentation

## 2022-03-07 LAB — COMPREHENSIVE METABOLIC PANEL
ALT: 11 U/L (ref 0–44)
AST: 13 U/L — ABNORMAL LOW (ref 15–41)
Albumin: 4.4 g/dL (ref 3.5–5.0)
Alkaline Phosphatase: 59 U/L (ref 38–126)
Anion gap: 2 — ABNORMAL LOW (ref 5–15)
BUN: 14 mg/dL (ref 6–20)
CO2: 22 mmol/L (ref 22–32)
Calcium: 8.9 mg/dL (ref 8.9–10.3)
Chloride: 111 mmol/L (ref 98–111)
Creatinine, Ser: 0.74 mg/dL (ref 0.44–1.00)
GFR, Estimated: >60 mL/min (ref >60)
Glucose, Bld: 105 mg/dL — ABNORMAL HIGH (ref 70–99)
Potassium: 3.8 mmol/L (ref 3.5–5.1)
Sodium: 135 mmol/L (ref 135–145)
Total Bilirubin: 0.7 mg/dL (ref 0.3–1.2)
Total Protein: 6.4 g/dL — ABNORMAL LOW (ref 6.5–8.1)

## 2022-03-07 LAB — CBC
HCT: 36.3 % (ref 36.0–46.0)
Hemoglobin: 12.8 g/dL (ref 12.0–15.0)
MCH: 29.6 pg (ref 26.0–34.0)
MCHC: 35.3 g/dL (ref 30.0–36.0)
MCV: 83.8 fL (ref 80.0–100.0)
Platelets: 177 10*3/uL (ref 150–400)
RBC: 4.33 MIL/uL (ref 3.87–5.11)
RDW: 11.8 % (ref 11.5–15.5)
WBC: 7 10*3/uL (ref 4.0–10.5)
nRBC: 0 % (ref 0.0–0.2)

## 2022-03-07 LAB — TSH: TSH: 1.817 u[IU]/mL (ref 0.350–4.500)

## 2022-03-07 MED ORDER — LORAZEPAM 2 MG/ML IJ SOLN
0.5000 mg | Freq: Once | INTRAMUSCULAR | Status: AC
  Administered 2022-03-07: 0.5 mg via INTRAVENOUS
  Filled 2022-03-07: qty 1

## 2022-03-07 MED ORDER — OXYCODONE-ACETAMINOPHEN 5-325 MG PO TABS
1.0000 | ORAL_TABLET | Freq: Once | ORAL | Status: AC
  Administered 2022-03-07: 1 via ORAL
  Filled 2022-03-07: qty 1

## 2022-03-07 MED ORDER — SODIUM CHLORIDE 0.9 % IV BOLUS
1000.0000 mL | Freq: Once | INTRAVENOUS | Status: AC
  Administered 2022-03-07: 1000 mL via INTRAVENOUS

## 2022-03-07 MED ORDER — ONDANSETRON HCL 4 MG/2ML IJ SOLN
4.0000 mg | Freq: Once | INTRAMUSCULAR | Status: AC
  Administered 2022-03-07: 4 mg via INTRAVENOUS
  Filled 2022-03-07: qty 2

## 2022-03-07 NOTE — ED Provider Notes (Signed)
[facility] Provider Note    Event Date/Time   First MD Initiated Contact with Patient 03/07/22 0703     (approximate)  History   Chief Complaint: Loss of Consciousness  HPI  Jennifer Francis is a 21 y.o. female with a past medical history of anxiety, vasovagal syndrome, POTS, presents to the emergency department after syncopal event.  According to the patient she has a long history of chronic pelvic pain, states this morning she woke up with discomfort felt nauseated and had an episode of vomiting.  Patient states nausea is typical for her as well she usually takes Zofran but states this event occurred quickly upon awakening so the patient did not have time to take Zofran.  Patient went to the bathroom and vomited and believes she lost consciousness at some point.  Patient states no one was awake to take her to the hospital so she called EMS to bring her to the hospital.  Patient states the pelvic pain is chronic and unchanged.  States she is seen doctors regarding the pain and no one is sure why she is having the pain.  Patient also states a chronic history of syncope, vasovagal episodes and POTS.  Patient has a cardiologist that she follows up with.  Currently the patient appears well, she does states she is having trouble speaking in fluent sentences at times.  Patient appears quite anxious on exam and will occasionally close her eyes and pause and then continue talking.  Denies any weakness or numbness of any arm or leg.  Physical Exam   Triage Vital Signs: ED Triage Vitals  Enc Vitals Group     BP 03/07/22 0630 (!) 117/94     Pulse Rate 03/07/22 0630 69     Resp 03/07/22 0630 18     Temp 03/07/22 0630 97.8 F (36.6 C)     Temp Source 03/07/22 0630 Oral     SpO2 03/07/22 0630 100 %     Weight --      Height --      Head Circumference --      Peak Flow --      Pain Score 03/07/22 0651 6     Pain Loc --      Pain Edu? --      Excl. in [place]? --     Most  recent vital signs: Vitals:   03/07/22 0630 03/07/22 0645  BP: (!) 117/94   Pulse: 69 93  Resp: 18   Temp: 97.8 F (36.6 C)   SpO2: 100% 99%    General: Awake, no distress.  CV:  Good peripheral perfusion.  Regular rate and rhythm  Resp:  Normal effort.  Equal breath sounds bilaterally.  Abd:  No distention.  Soft, nontender.  No rebound or guarding.  ED Results / Procedures / Treatments   EKG  EKG viewed and interpreted by myself shows a normal sinus rhythm 80 bpm with a narrow QRS, normal axis, normal intervals, nonspecific ST changes.  MEDICATIONS ORDERED IN ED: Medications  sodium chloride 0.9 % bolus 1,000 mL (has no administration in time range)  ondansetron (ZOFRAN) injection 4 mg (has no administration in time range)  LORazepam (ATIVAN) injection 0.5 mg (has no administration in time range)     IMPRESSION / MDM / ASSESSMENT AND PLAN / ED COURSE  I reviewed the triage vital signs and the nursing notes.  Patient's presentation is most consistent with acute presentation with potential threat to life  or bodily function.  Patient presents emergency department after syncopal episode.  Patient states a history of chronic pelvic pain as well as nausea which seemed to cause her syncopal episodes.  Patient states the only symptom that is different today she feels like she is having trouble expressing herself.  During exam patient will occasionally stop talking close her eyes and take several deep breaths and then open her eyes and start talking again.  Patient appears quite anxious on exam and states a history of anxiety for which she is prescribed hydroxyzine and Xanax.  Given the new symptom we will check labs including CBC, chemistry, TSH we will check a urinalysis and a pregnancy test of the patient states she is on Depo-Provera.  We will dose a small dose of anxiety medication, nausea medication and IV hydrate while awaiting results.  Patient's mother is coming to the hospital  as well and will be here shortly.  Patient's work-up is reassuring, CBC is normal, chemistry is normal, TSH is normal, EKG is normal.  Patient is feeling better, blood pressure currently 105/60.  Patient has received 1 L of normal saline.  Mom is here with the patient as well.  Overall patient appears well we will have her follow-up with her cardiologist and OB/GYN.  FINAL CLINICAL IMPRESSION(S) / ED DIAGNOSES   Syncope   Note:  This document was prepared using Dragon voice recognition software and may include unintentional dictation errors.   Minna Antis, MD 03/07/22 [phone]

## 2022-03-07 NOTE — ED Triage Notes (Signed)
Patient BIB EMS for evaluation of syncope.  Reports she has a hx of Vasovagal Syncope with POTS symptoms.  C/o chronic pelvic pain. During triage patient will intermittently pause and stare blankly.

## 2022-10-26 IMAGING — RF DG SMALL BOWEL
5 series · 9 of 9 positions shown · non-contrast
Comparison: None.

CLINICAL DATA: Weight loss, diarrhea

EXAM:
SMALL BOWEL SERIES
TECHNIQUE: Following ingestion of thin barium, serial small bowel images were
obtained including spot views of the terminal ileum.
FLUOROSCOPY TIME:  Fluoroscopy Time:  [DATE]
Number of Acquired Spot Images: 12

[Series 1: t abdomen supine · 0.14mm/px · 5 of 5 slices shown]
[im 1/5]
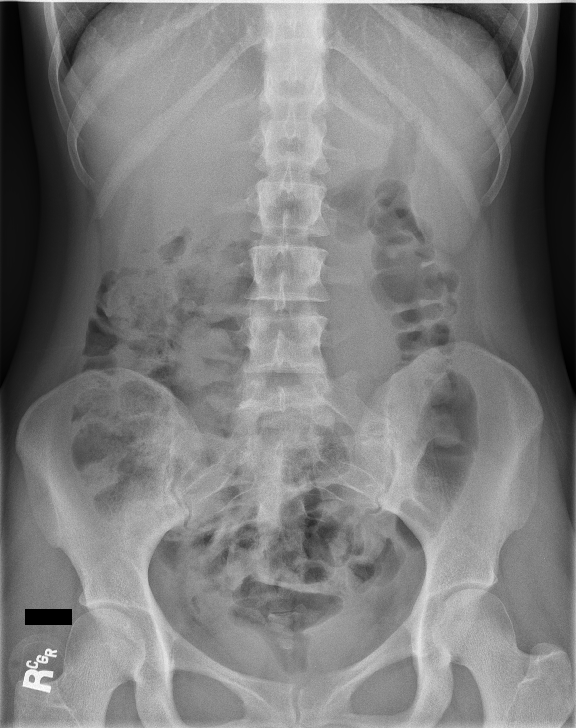
[im 2/5]
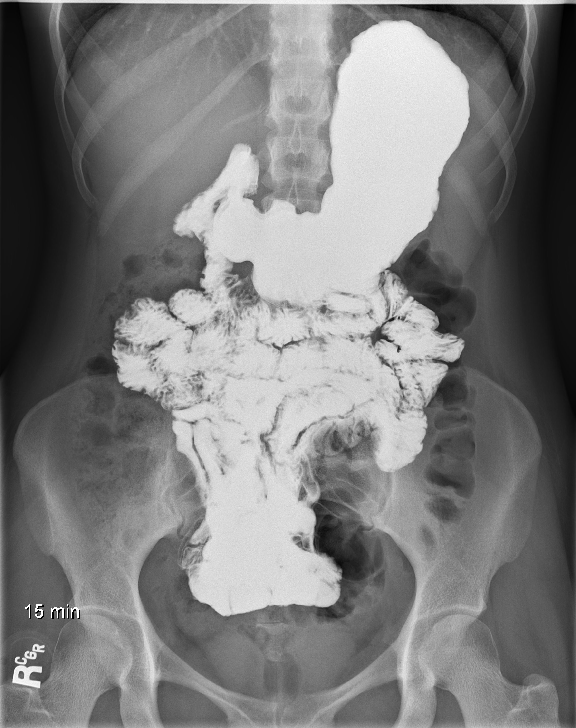
[im 3/5]
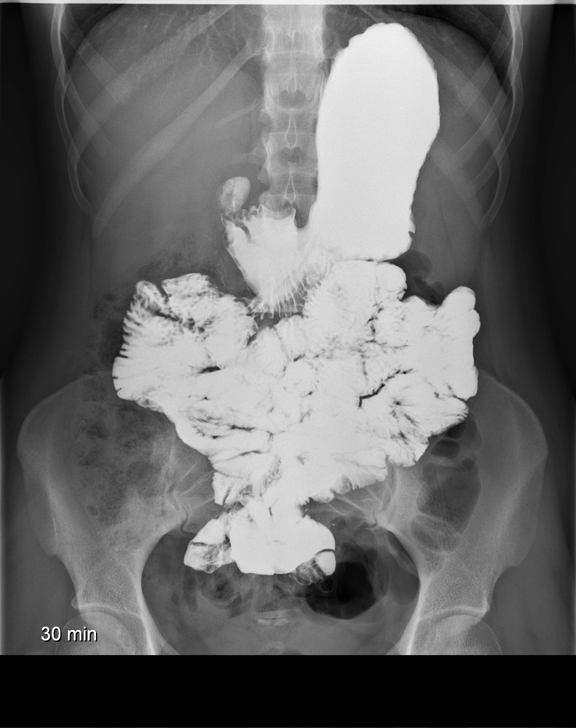
[im 4/5]
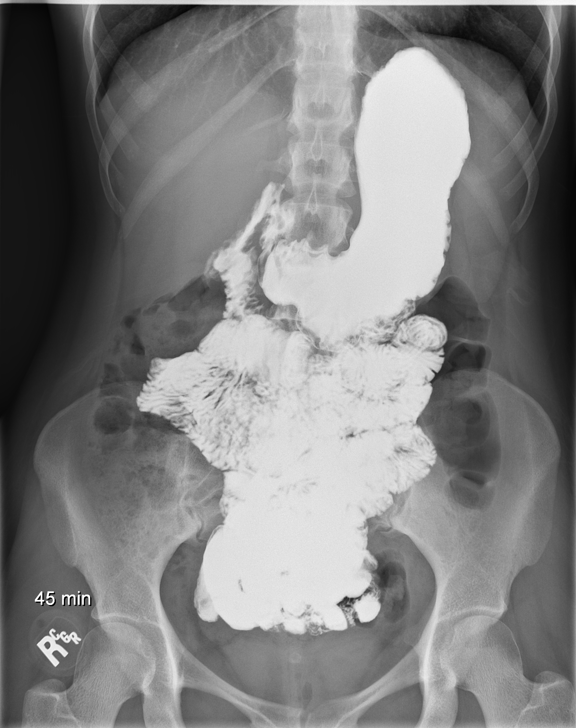
[im 5/5]
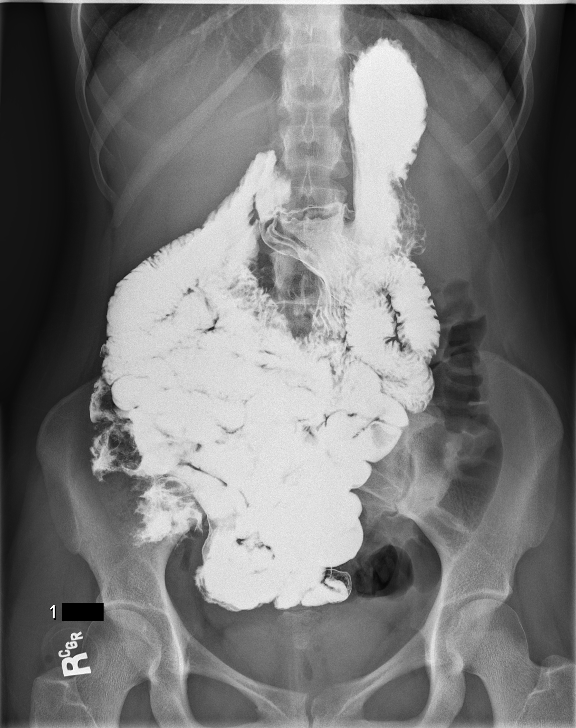

[Series 1: fluoro_barium singleshot_bw · 0.18mm/px · 1 of 1 slices shown (1 of 4)]
[im 1/1]
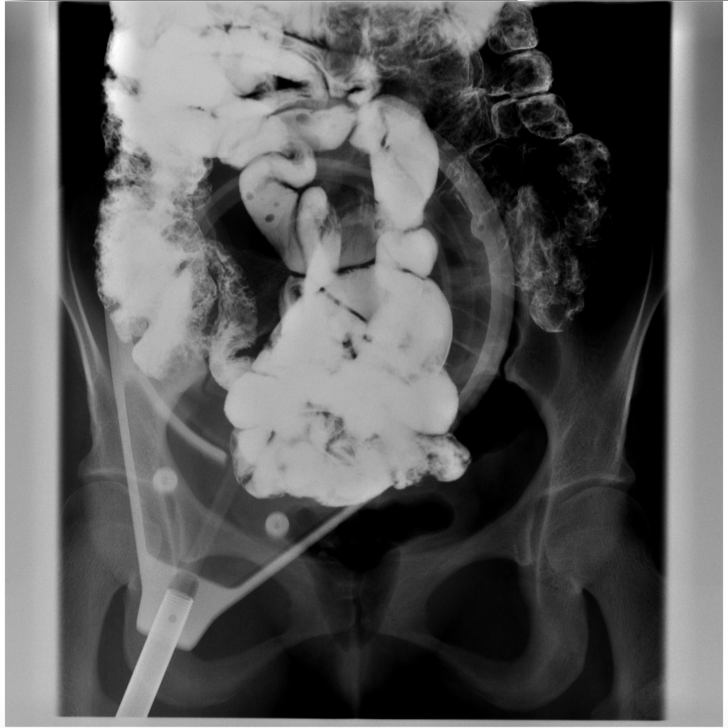

[Series 2: fluoro_barium singleshot_bw · 0.18mm/px · 1 of 1 slices shown (2 of 4)]
[im 1/1]
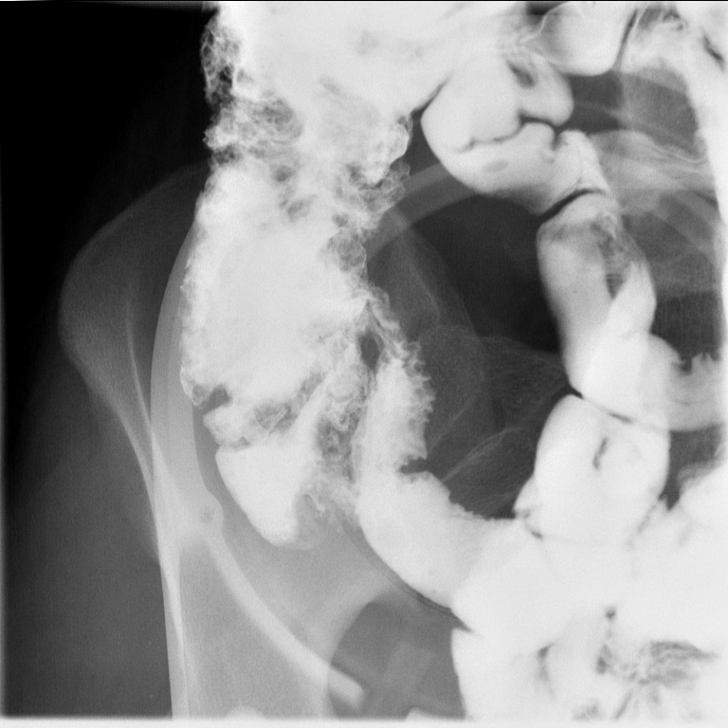

[Series 3: fluoro_barium singleshot_bw · 0.18mm/px · 1 of 1 slices shown (3 of 4)]
[im 1/1]
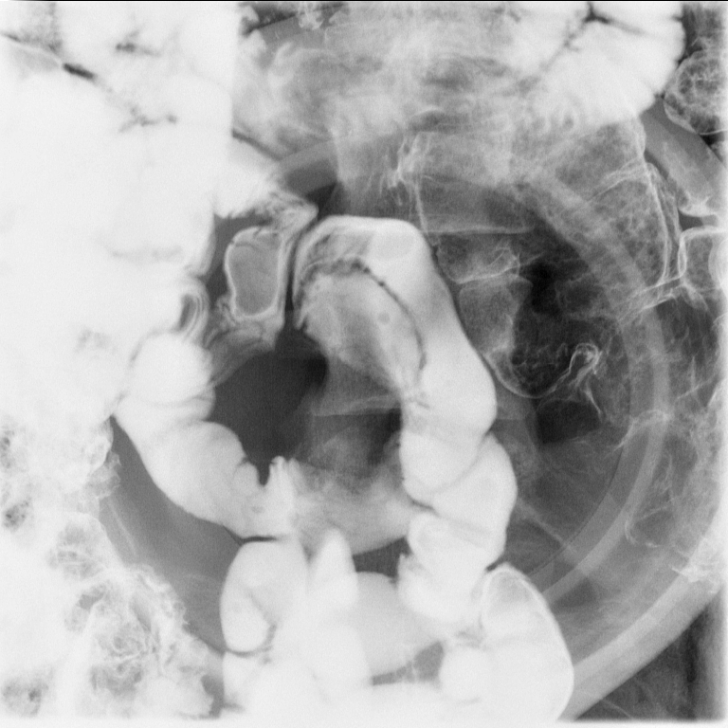

[Series 4: fluoro_barium singleshot_bw · 0.18mm/px · 1 of 1 slices shown (4 of 4)]
[im 1/1]
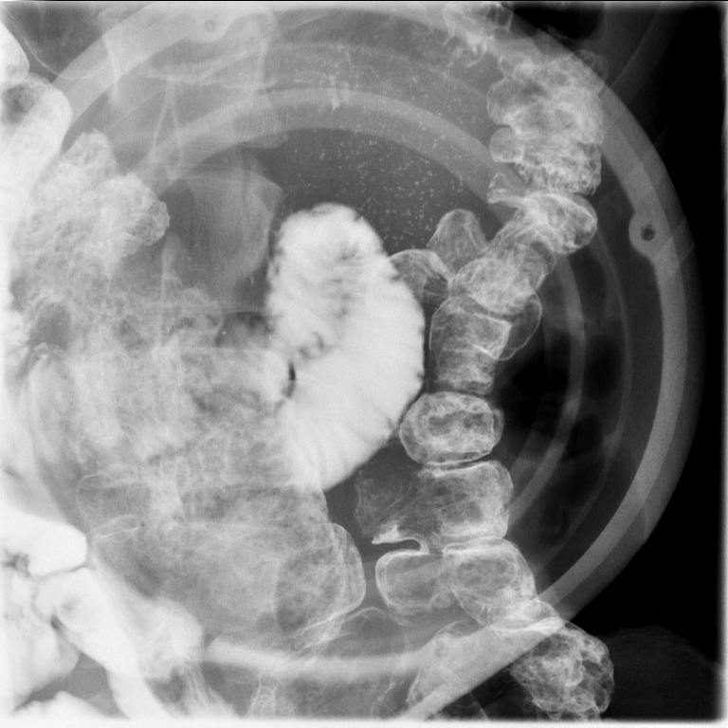

[9 of 9 positions shown; findings below may reference images not displayed]

FINDINGS: Normal transit of ingested enteric contrast to the colon at
approximately 1 hour. Normal appearance of the small bowel without
evidence of stricturing or adhesion on fluoroscopic paddle
examination.
IMPRESSION: Normal small bowel follow-through examination. No findings to
explain weight loss or diarrhea.
